# Patient Record
Sex: Female | Born: 1935 | Race: Black or African American | Hispanic: No | Smoking: Never smoker
Health system: Southern US, Community
[De-identification: ages and names within clinical notes are randomized; demographics above are authoritative.]

## PROBLEM LIST (undated history)

## (undated) DIAGNOSIS — I4891 Unspecified atrial fibrillation: Secondary | ICD-10-CM

## (undated) DIAGNOSIS — G43909 Migraine, unspecified, not intractable, without status migrainosus: Secondary | ICD-10-CM

## (undated) HISTORY — DX: Migraine, unspecified, not intractable, without status migrainosus: G43.909

## (undated) HISTORY — PX: TOTAL HIP ARTHROPLASTY: SHX124

## (undated) HISTORY — DX: Unspecified atrial fibrillation: I48.91

---

## 1999-06-21 ENCOUNTER — Ambulatory Visit (HOSPITAL_COMMUNITY): Admission: RE | Admit: 1999-06-21 | Discharge: 1999-06-21 | Payer: Self-pay | Admitting: *Deleted

## 2002-05-14 ENCOUNTER — Ambulatory Visit (HOSPITAL_COMMUNITY): Admission: RE | Admit: 2002-05-14 | Discharge: 2002-05-14 | Payer: Self-pay | Admitting: Surgery

## 2002-05-14 ENCOUNTER — Encounter: Payer: Self-pay | Admitting: Surgery

## 2010-12-23 ENCOUNTER — Encounter: Payer: Self-pay | Admitting: Internal Medicine

## 2014-12-02 HISTORY — PX: PERMANENT PACEMAKER INSERTION: SHX6023

## 2016-11-05 DIAGNOSIS — I10 Essential (primary) hypertension: Secondary | ICD-10-CM | POA: Diagnosis not present

## 2016-11-14 DIAGNOSIS — R609 Edema, unspecified: Secondary | ICD-10-CM | POA: Diagnosis not present

## 2016-11-14 DIAGNOSIS — Z79899 Other long term (current) drug therapy: Secondary | ICD-10-CM | POA: Diagnosis not present

## 2016-11-14 DIAGNOSIS — Z6824 Body mass index (BMI) 24.0-24.9, adult: Secondary | ICD-10-CM | POA: Diagnosis not present

## 2016-12-10 DIAGNOSIS — I10 Essential (primary) hypertension: Secondary | ICD-10-CM | POA: Diagnosis not present

## 2016-12-25 DIAGNOSIS — Z6824 Body mass index (BMI) 24.0-24.9, adult: Secondary | ICD-10-CM | POA: Diagnosis not present

## 2016-12-25 DIAGNOSIS — R609 Edema, unspecified: Secondary | ICD-10-CM | POA: Diagnosis not present

## 2016-12-25 DIAGNOSIS — H9319 Tinnitus, unspecified ear: Secondary | ICD-10-CM | POA: Diagnosis not present

## 2016-12-25 DIAGNOSIS — Z79899 Other long term (current) drug therapy: Secondary | ICD-10-CM | POA: Diagnosis not present

## 2017-01-06 DIAGNOSIS — I10 Essential (primary) hypertension: Secondary | ICD-10-CM | POA: Diagnosis not present

## 2017-01-30 DIAGNOSIS — I10 Essential (primary) hypertension: Secondary | ICD-10-CM | POA: Diagnosis not present

## 2017-02-03 DIAGNOSIS — R42 Dizziness and giddiness: Secondary | ICD-10-CM | POA: Diagnosis not present

## 2017-02-03 DIAGNOSIS — R609 Edema, unspecified: Secondary | ICD-10-CM | POA: Diagnosis not present

## 2017-02-03 DIAGNOSIS — I10 Essential (primary) hypertension: Secondary | ICD-10-CM | POA: Diagnosis not present

## 2017-02-03 DIAGNOSIS — R51 Headache: Secondary | ICD-10-CM | POA: Diagnosis not present

## 2017-02-03 DIAGNOSIS — H9311 Tinnitus, right ear: Secondary | ICD-10-CM | POA: Diagnosis not present

## 2017-02-05 DIAGNOSIS — R42 Dizziness and giddiness: Secondary | ICD-10-CM | POA: Diagnosis not present

## 2017-02-05 DIAGNOSIS — R609 Edema, unspecified: Secondary | ICD-10-CM | POA: Diagnosis not present

## 2017-02-05 DIAGNOSIS — R05 Cough: Secondary | ICD-10-CM | POA: Diagnosis not present

## 2017-02-05 DIAGNOSIS — H9311 Tinnitus, right ear: Secondary | ICD-10-CM | POA: Diagnosis not present

## 2017-02-05 DIAGNOSIS — Z950 Presence of cardiac pacemaker: Secondary | ICD-10-CM | POA: Diagnosis not present

## 2017-02-05 DIAGNOSIS — R7989 Other specified abnormal findings of blood chemistry: Secondary | ICD-10-CM | POA: Diagnosis not present

## 2017-02-05 DIAGNOSIS — I517 Cardiomegaly: Secondary | ICD-10-CM | POA: Diagnosis not present

## 2017-02-10 DIAGNOSIS — R609 Edema, unspecified: Secondary | ICD-10-CM | POA: Diagnosis not present

## 2017-02-10 DIAGNOSIS — I10 Essential (primary) hypertension: Secondary | ICD-10-CM | POA: Diagnosis not present

## 2017-02-10 DIAGNOSIS — E215 Disorder of parathyroid gland, unspecified: Secondary | ICD-10-CM | POA: Diagnosis not present

## 2017-02-13 DIAGNOSIS — R609 Edema, unspecified: Secondary | ICD-10-CM | POA: Diagnosis not present

## 2017-02-13 DIAGNOSIS — E559 Vitamin D deficiency, unspecified: Secondary | ICD-10-CM | POA: Diagnosis not present

## 2017-02-13 DIAGNOSIS — I10 Essential (primary) hypertension: Secondary | ICD-10-CM | POA: Diagnosis not present

## 2017-03-03 DIAGNOSIS — I10 Essential (primary) hypertension: Secondary | ICD-10-CM | POA: Diagnosis not present

## 2017-03-11 DIAGNOSIS — R51 Headache: Secondary | ICD-10-CM | POA: Diagnosis not present

## 2017-03-26 DIAGNOSIS — I10 Essential (primary) hypertension: Secondary | ICD-10-CM | POA: Diagnosis not present

## 2017-03-26 DIAGNOSIS — R42 Dizziness and giddiness: Secondary | ICD-10-CM | POA: Diagnosis not present

## 2017-03-26 DIAGNOSIS — R51 Headache: Secondary | ICD-10-CM | POA: Diagnosis not present

## 2017-03-26 DIAGNOSIS — H9209 Otalgia, unspecified ear: Secondary | ICD-10-CM | POA: Diagnosis not present

## 2017-03-26 DIAGNOSIS — H9311 Tinnitus, right ear: Secondary | ICD-10-CM | POA: Diagnosis not present

## 2017-03-26 DIAGNOSIS — R609 Edema, unspecified: Secondary | ICD-10-CM | POA: Diagnosis not present

## 2017-03-26 DIAGNOSIS — R269 Unspecified abnormalities of gait and mobility: Secondary | ICD-10-CM | POA: Diagnosis not present

## 2017-04-03 DIAGNOSIS — I10 Essential (primary) hypertension: Secondary | ICD-10-CM | POA: Diagnosis not present

## 2017-04-14 DIAGNOSIS — H9311 Tinnitus, right ear: Secondary | ICD-10-CM | POA: Diagnosis not present

## 2017-04-14 DIAGNOSIS — Q181 Preauricular sinus and cyst: Secondary | ICD-10-CM | POA: Diagnosis not present

## 2017-04-14 DIAGNOSIS — H903 Sensorineural hearing loss, bilateral: Secondary | ICD-10-CM | POA: Diagnosis not present

## 2017-04-17 DIAGNOSIS — R262 Difficulty in walking, not elsewhere classified: Secondary | ICD-10-CM | POA: Diagnosis not present

## 2017-04-17 DIAGNOSIS — R531 Weakness: Secondary | ICD-10-CM | POA: Diagnosis not present

## 2017-04-17 DIAGNOSIS — R269 Unspecified abnormalities of gait and mobility: Secondary | ICD-10-CM | POA: Diagnosis not present

## 2017-04-18 DIAGNOSIS — E559 Vitamin D deficiency, unspecified: Secondary | ICD-10-CM | POA: Diagnosis not present

## 2017-04-18 DIAGNOSIS — R799 Abnormal finding of blood chemistry, unspecified: Secondary | ICD-10-CM | POA: Diagnosis not present

## 2017-04-25 DIAGNOSIS — R799 Abnormal finding of blood chemistry, unspecified: Secondary | ICD-10-CM | POA: Diagnosis not present

## 2017-05-05 DIAGNOSIS — I10 Essential (primary) hypertension: Secondary | ICD-10-CM | POA: Diagnosis not present

## 2017-05-12 DIAGNOSIS — R799 Abnormal finding of blood chemistry, unspecified: Secondary | ICD-10-CM | POA: Diagnosis not present

## 2017-05-27 DIAGNOSIS — I10 Essential (primary) hypertension: Secondary | ICD-10-CM | POA: Diagnosis not present

## 2017-06-03 DIAGNOSIS — I10 Essential (primary) hypertension: Secondary | ICD-10-CM | POA: Diagnosis not present

## 2017-06-12 DIAGNOSIS — H02831 Dermatochalasis of right upper eyelid: Secondary | ICD-10-CM | POA: Diagnosis not present

## 2017-06-12 DIAGNOSIS — H2513 Age-related nuclear cataract, bilateral: Secondary | ICD-10-CM | POA: Diagnosis not present

## 2017-06-12 DIAGNOSIS — H25013 Cortical age-related cataract, bilateral: Secondary | ICD-10-CM | POA: Diagnosis not present

## 2017-06-12 DIAGNOSIS — H01001 Unspecified blepharitis right upper eyelid: Secondary | ICD-10-CM | POA: Diagnosis not present

## 2017-06-12 DIAGNOSIS — H04123 Dry eye syndrome of bilateral lacrimal glands: Secondary | ICD-10-CM | POA: Diagnosis not present

## 2017-07-02 DIAGNOSIS — I10 Essential (primary) hypertension: Secondary | ICD-10-CM | POA: Diagnosis not present

## 2017-07-11 DIAGNOSIS — Z7901 Long term (current) use of anticoagulants: Secondary | ICD-10-CM | POA: Diagnosis not present

## 2017-07-11 DIAGNOSIS — I517 Cardiomegaly: Secondary | ICD-10-CM | POA: Diagnosis not present

## 2017-07-11 DIAGNOSIS — Z950 Presence of cardiac pacemaker: Secondary | ICD-10-CM | POA: Diagnosis not present

## 2017-07-11 DIAGNOSIS — I340 Nonrheumatic mitral (valve) insufficiency: Secondary | ICD-10-CM | POA: Diagnosis not present

## 2017-07-11 DIAGNOSIS — R079 Chest pain, unspecified: Secondary | ICD-10-CM | POA: Diagnosis not present

## 2017-07-11 DIAGNOSIS — N189 Chronic kidney disease, unspecified: Secondary | ICD-10-CM | POA: Diagnosis not present

## 2017-07-11 DIAGNOSIS — R000 Tachycardia, unspecified: Secondary | ICD-10-CM | POA: Diagnosis not present

## 2017-07-11 DIAGNOSIS — I351 Nonrheumatic aortic (valve) insufficiency: Secondary | ICD-10-CM | POA: Diagnosis not present

## 2017-07-11 DIAGNOSIS — I358 Other nonrheumatic aortic valve disorders: Secondary | ICD-10-CM | POA: Diagnosis not present

## 2017-07-11 DIAGNOSIS — I495 Sick sinus syndrome: Secondary | ICD-10-CM | POA: Diagnosis not present

## 2017-07-11 DIAGNOSIS — I480 Paroxysmal atrial fibrillation: Secondary | ICD-10-CM | POA: Diagnosis not present

## 2017-07-11 DIAGNOSIS — R002 Palpitations: Secondary | ICD-10-CM | POA: Diagnosis not present

## 2017-07-11 DIAGNOSIS — I2510 Atherosclerotic heart disease of native coronary artery without angina pectoris: Secondary | ICD-10-CM | POA: Diagnosis not present

## 2017-07-11 DIAGNOSIS — I129 Hypertensive chronic kidney disease with stage 1 through stage 4 chronic kidney disease, or unspecified chronic kidney disease: Secondary | ICD-10-CM | POA: Diagnosis not present

## 2017-07-11 DIAGNOSIS — I214 Non-ST elevation (NSTEMI) myocardial infarction: Secondary | ICD-10-CM | POA: Diagnosis not present

## 2017-07-11 DIAGNOSIS — I10 Essential (primary) hypertension: Secondary | ICD-10-CM | POA: Diagnosis not present

## 2017-07-11 DIAGNOSIS — I080 Rheumatic disorders of both mitral and aortic valves: Secondary | ICD-10-CM | POA: Diagnosis not present

## 2017-07-16 DIAGNOSIS — R42 Dizziness and giddiness: Secondary | ICD-10-CM | POA: Diagnosis not present

## 2017-07-16 DIAGNOSIS — Z1389 Encounter for screening for other disorder: Secondary | ICD-10-CM | POA: Diagnosis not present

## 2017-07-16 DIAGNOSIS — I10 Essential (primary) hypertension: Secondary | ICD-10-CM | POA: Diagnosis not present

## 2017-07-16 DIAGNOSIS — Z136 Encounter for screening for cardiovascular disorders: Secondary | ICD-10-CM | POA: Diagnosis not present

## 2017-07-16 DIAGNOSIS — R269 Unspecified abnormalities of gait and mobility: Secondary | ICD-10-CM | POA: Diagnosis not present

## 2017-07-16 DIAGNOSIS — R609 Edema, unspecified: Secondary | ICD-10-CM | POA: Diagnosis not present

## 2017-07-16 DIAGNOSIS — R799 Abnormal finding of blood chemistry, unspecified: Secondary | ICD-10-CM | POA: Diagnosis not present

## 2017-07-18 DIAGNOSIS — R799 Abnormal finding of blood chemistry, unspecified: Secondary | ICD-10-CM | POA: Diagnosis not present

## 2017-07-31 DIAGNOSIS — I495 Sick sinus syndrome: Secondary | ICD-10-CM | POA: Diagnosis not present

## 2017-07-31 DIAGNOSIS — Z45018 Encounter for adjustment and management of other part of cardiac pacemaker: Secondary | ICD-10-CM | POA: Diagnosis not present

## 2017-07-31 DIAGNOSIS — I951 Orthostatic hypotension: Secondary | ICD-10-CM | POA: Diagnosis not present

## 2017-07-31 DIAGNOSIS — I10 Essential (primary) hypertension: Secondary | ICD-10-CM | POA: Diagnosis not present

## 2017-07-31 DIAGNOSIS — I4891 Unspecified atrial fibrillation: Secondary | ICD-10-CM | POA: Diagnosis not present

## 2017-07-31 DIAGNOSIS — I480 Paroxysmal atrial fibrillation: Secondary | ICD-10-CM | POA: Diagnosis not present

## 2017-08-05 DIAGNOSIS — I10 Essential (primary) hypertension: Secondary | ICD-10-CM | POA: Diagnosis not present

## 2017-08-08 DIAGNOSIS — R42 Dizziness and giddiness: Secondary | ICD-10-CM | POA: Diagnosis not present

## 2017-08-08 DIAGNOSIS — R799 Abnormal finding of blood chemistry, unspecified: Secondary | ICD-10-CM | POA: Diagnosis not present

## 2017-08-08 DIAGNOSIS — I249 Acute ischemic heart disease, unspecified: Secondary | ICD-10-CM | POA: Diagnosis not present

## 2017-08-08 DIAGNOSIS — R197 Diarrhea, unspecified: Secondary | ICD-10-CM | POA: Diagnosis not present

## 2017-08-08 DIAGNOSIS — R0602 Shortness of breath: Secondary | ICD-10-CM | POA: Diagnosis not present

## 2017-08-08 DIAGNOSIS — M79609 Pain in unspecified limb: Secondary | ICD-10-CM | POA: Diagnosis not present

## 2017-08-13 DIAGNOSIS — H9311 Tinnitus, right ear: Secondary | ICD-10-CM | POA: Diagnosis not present

## 2017-08-19 DIAGNOSIS — E210 Primary hyperparathyroidism: Secondary | ICD-10-CM | POA: Diagnosis not present

## 2017-08-21 DIAGNOSIS — R233 Spontaneous ecchymoses: Secondary | ICD-10-CM | POA: Diagnosis not present

## 2017-08-21 DIAGNOSIS — L299 Pruritus, unspecified: Secondary | ICD-10-CM | POA: Diagnosis not present

## 2017-09-16 ENCOUNTER — Ambulatory Visit: Payer: Medicare Other | Admitting: Neurology

## 2017-09-22 ENCOUNTER — Ambulatory Visit (INDEPENDENT_AMBULATORY_CARE_PROVIDER_SITE_OTHER): Payer: Medicare Other | Admitting: Neurology

## 2017-09-22 ENCOUNTER — Encounter: Payer: Self-pay | Admitting: Neurology

## 2017-09-22 DIAGNOSIS — R270 Ataxia, unspecified: Secondary | ICD-10-CM

## 2017-09-22 DIAGNOSIS — G3281 Cerebellar ataxia in diseases classified elsewhere: Secondary | ICD-10-CM

## 2017-09-22 DIAGNOSIS — R413 Other amnesia: Secondary | ICD-10-CM | POA: Diagnosis not present

## 2017-09-22 DIAGNOSIS — R2689 Other abnormalities of gait and mobility: Secondary | ICD-10-CM

## 2017-09-22 DIAGNOSIS — M48062 Spinal stenosis, lumbar region with neurogenic claudication: Secondary | ICD-10-CM

## 2017-09-22 DIAGNOSIS — W19XXXA Unspecified fall, initial encounter: Secondary | ICD-10-CM

## 2017-09-22 DIAGNOSIS — M5442 Lumbago with sciatica, left side: Secondary | ICD-10-CM

## 2017-09-22 DIAGNOSIS — G20 Parkinson's disease: Secondary | ICD-10-CM

## 2017-09-22 DIAGNOSIS — M5441 Lumbago with sciatica, right side: Secondary | ICD-10-CM

## 2017-09-22 DIAGNOSIS — R269 Unspecified abnormalities of gait and mobility: Secondary | ICD-10-CM | POA: Diagnosis not present

## 2017-09-22 DIAGNOSIS — Z9181 History of falling: Secondary | ICD-10-CM

## 2017-09-22 DIAGNOSIS — M542 Cervicalgia: Secondary | ICD-10-CM

## 2017-09-22 NOTE — Patient Instructions (Signed)
Remember to drink plenty of fluid, eat healthy meals and do not skip any meals. Try to eat protein with a every meal and eat a healthy snack such as fruit or nuts in between meals. Try to keep a regular sleep-wake schedule and try to exercise daily, particularly in the form of walking, 20-30 minutes a day, if you can.   As far as diagnostic testing: [facility], CT of the brain, neck and low back,   I would like to see you back in 3 months, sooner if we need to. Please call us with any interim questions, concerns, problems, updates or refill requests.   Our phone number is [phone]. We also have an after hours call service for urgent matters and there is a physician on-call for urgent questions. For any emergencies you know to call 911 or go to the nearest emergency room

## 2017-09-22 NOTE — Progress Notes (Addendum)
[facility]    Provider:  Dr Jaynee Eagles Referring Provider: Barnie Mort, NP Primary Care Physician:  Barnie Mort, NP  CC:  Walking like a drunk  HPI:  Kim Townsend is a 83 y.o. female here as a referral from Dr. Kalman Shan for dizziness. PMHx migraine and afib on Eliquis, s/p pacemaker, HLD, HTN. She says she walks "like a drunk person". She denies any significant dizziness or vertigo or lightheadedness except maybe briefly when getting out of bed but this is not the reason for her gait abnormality as she continuous feels drunk when walking (not dizzy)Has been ongoing this year.  She denies feeling dizzy or lightheaded or it isn't coincident with the "feeling drunk", she can't write straight. When she gets up to walk she feels drunk, she can't walk good. She got a pacemaker 2 years ago, but no inciting events, She has difficulty getting out of low seats. She feels sleepy during the day. She has no tremors or shaking. No focal weakness. But she feels like she can't reach up high. She is losing weight. No neck pain or back pain. She feels it is more in her legs. She has a lot of swelling. She has memory loss as well. She has daily headaches. No numbness or tingling in the feet. She has memory problems, forgets where she puts things. She lives with her son. No falls. No problems chewing or swallowing. She walks slower. She has been to the ENT. She has Tinnitus. She has nose running. Her son has parkinson's disease he is 36.   Reviewed pcp notes, she stated she complained of dizziness when she got up to walk. Worsened over several days. Stated she is "walking like a drunk person". Patient referred to Cardiology.    CT head: personally reviewed images. Stable mild atrophy more pronounced in the frontoparietal areas. No ventriculomegaly.Prior infarct chronic left whote matter frontal. Mild white matter changes likely microvascular ischemic changes.   Review of Systems: Patient  complains of symptoms per HPI as well as the following symptoms: feeling cold, memory loss, headache, mumur, swelling in legs, hearing loss, ringing in ears, spinning, rash, itching. Pertinent negatives and positives per HPI. All others negative.   Social History   Social History  . Marital status: Widowed    Spouse name: N/A  . Number of children: N/A  . Years of education: N/A   Occupational History  . retired    Social History Main Topics  . Smoking status: Never Smoker  . Smokeless tobacco: Never Used  . Alcohol use No  . Drug use: No  . Sexual activity: Not on file   Other Topics Concern  . Not on file   Social History Narrative   Lives at home with son   Rt handed   1 cup of caffeine daily    Family History  Problem Relation Age of Onset  . Alzheimer's disease Mother   . Parkinson's disease Neg Hx     Past Medical History:  Diagnosis Date  . Atrial fibrillation ([place])   . Migraine   . Migraine     Past Surgical History:  Procedure Laterality Date  . PERMANENT PACEMAKER INSERTION  2016  . TOTAL HIP ARTHROPLASTY Bilateral     Current Outpatient Prescriptions  Medication Sig Dispense Refill  . ELIQUIS 2.5 MG TABS tablet Take 1 tablet by mouth 2 (two) times daily.     . furosemide (LASIX) 40 MG tablet Take 1 tablet by mouth  daily.    . losartan (COZAAR) 25 MG tablet Take 1 tablet by mouth daily.    . sotalol (BETAPACE) 80 MG tablet Take 1 tablet by mouth 2 (two) times daily.    Marland Kitchen triamcinolone cream (KENALOG) 0.1 % Apply 1 application topically 2 (two) times daily as needed. Apply to legs     No current facility-administered medications for this visit.     Allergies as of 09/22/2017  . (Not on File)    Vitals: There were no vitals taken for this visit. Last Weight:  Wt Readings from Last 1 Encounters:  No data found for Wt   Last Height:   Ht Readings from Last 1 Encounters:  No data found for Ht   BP sitting 160/82 P 60; sitting 196/84 P64,  standing 223/84 pulse 61   Physical exam: Exam: Gen: NAD, conversant CV: RRR, +SEM. No Carotid Bruits. +peripheral edema, warm, nontender Eyes: Conjunctivae clear without exudates or hemorrhage  Neuro: Detailed Neurologic Exam  Speech:    Speech is normal; fluent and spontaneous with normal comprehension.  Cognition:     MMSE - Mini Mental State Exam 09/22/2017  Orientation to time 3  Orientation to Place 5  Registration 3  Attention/ Calculation 5  Recall 0  Language- name 2 objects 2  Language- repeat 1  Language- follow 3 step command 3  Language- read & follow direction 1  Write a sentence 1  Copy design 0  Total score 24    Cranial Nerves: Hypomimia    The pupils are equal, round, and reactive to light. Attempted fundoscopic exam couldnot visualize. Marland Kitchenleft homonomous upper quadrant hemianopia. Extraocular movements are intact. Trigeminal sensation is intact and the muscles of mastication are normal. The face is symmetric. The palate elevates in the midline. Hearing impaired. Voice is hypophonic. Shoulder shrug is normal. The tongue has normal motion without fasciculations.   Coordination:    Dec amplitude finger taps with bradykinesia  Gait:    Stooped, short strides, narrow stance, stooped, decreased arm swing, imbalance  Motor Observation:    No asymmetry, no atrophy, and no involuntary movements noted. Tone:    Possibly mild cogwheeling right arm with facilitation  Posture:    Posture is normal. normal erect    Strength: Difficult but prox weakness     Strength is V/V in the upper and lower limbs.      Sensation: intact to LT     Reflex Exam:  DTR's:    Deep tendon reflexes in the upper and lower extremities are symmetrical bilaterally, hypo in the lowers and brisk in the uppers for age  Toes:    The toes are equivocal bilaterally.   Clonus:    Clonus is absent.       Assessment/Plan:  83 year old with Parkinsonism. Need to evaluate for other  causes of gait instability and imbalalnce. MMSE 24/30.   Fall risk: Needs physical therapy will send to her home Recommend CT of the head and brain to evaluate for strokes, other causes of gait ataxia for reversible causes of dementia CT of the cervical spine to evaluate for myelopathy as a cause of ataxia for surgical and other treatment evaluation CT of the lumbar spine to evaluate for lumbar stenosis with neurogenic claudication as a cause for her ataxia for surgical or other treatment options If can't find any other reason recommend DAT Scan Labs today  Patient was seen in neurosurgery.  They did not think that the moderate cervical  stenosis was causing any of her gait symptoms.  Stated while she does have a significant amount of both cervical and lumbar spondylitic disease, I do not think that these are responsible for her gait.  He did not think the symptoms are related to cervical myelopathy or lumbar spine disease neurogenic claudication.  Orders Placed This Encounter  Procedures  . CT HEAD WO CONTRAST  . CT CERVICAL SPINE WO CONTRAST  . CT LUMBAR SPINE W WO CONTRAST  . CBC  . Comprehensive metabolic panel  . RPR  . B12 and Folate Panel  . Methylmalonic acid, serum  . CK  . Ambulatory referral to Home Health   Cc:  Barnie Mort, NP  Sarina Ill, MD  [facility] [address] [place], [place] [address]  Phone [phone] Fax [phone]

## 2017-09-23 ENCOUNTER — Encounter: Payer: Self-pay | Admitting: Neurology

## 2017-09-23 DIAGNOSIS — G20 Parkinson's disease: Secondary | ICD-10-CM | POA: Insufficient documentation

## 2017-09-25 ENCOUNTER — Telehealth: Payer: Self-pay | Admitting: Neurology

## 2017-09-25 LAB — COMPREHENSIVE METABOLIC PANEL
ALT: 10 IU/L (ref 0–32)
AST: 19 IU/L (ref 0–40)
Albumin/Globulin Ratio: 1.4 (ref 1.2–2.2)
Albumin: 4.1 g/dL (ref 3.5–4.7)
Alkaline Phosphatase: 117 IU/L (ref 39–117)
BUN/Creatinine Ratio: 21 (ref 12–28)
BUN: 21 mg/dL (ref 8–27)
Bilirubin Total: 0.6 mg/dL (ref 0.0–1.2)
CO2: 23 mmol/L (ref 20–29)
Calcium: 11.3 mg/dL — ABNORMAL HIGH (ref 8.7–10.3)
Chloride: 105 mmol/L (ref 96–106)
Creatinine, Ser: 1.01 mg/dL — ABNORMAL HIGH (ref 0.57–1.00)
GFR calc Af Amer: 57 mL/min/{1.73_m2} — ABNORMAL LOW (ref >59)
GFR calc non Af Amer: 49 mL/min/{1.73_m2} — ABNORMAL LOW (ref >59)
Globulin, Total: 3 g/dL (ref 1.5–4.5)
Glucose: 78 mg/dL (ref 65–99)
Potassium: 4.5 mmol/L (ref 3.5–5.2)
Sodium: 141 mmol/L (ref 134–144)
Total Protein: 7.1 g/dL (ref 6.0–8.5)

## 2017-09-25 LAB — CBC
Hematocrit: 40.9 % (ref 34.0–46.6)
Hemoglobin: 13 g/dL (ref 11.1–15.9)
MCH: 28.6 pg (ref 26.6–33.0)
MCHC: 31.8 g/dL (ref 31.5–35.7)
MCV: 90 fL (ref 79–97)
Platelets: 258 10*3/uL (ref 150–379)
RBC: 4.54 x10E6/uL (ref 3.77–5.28)
RDW: 13.3 % (ref 12.3–15.4)
WBC: 6.9 10*3/uL (ref 3.4–10.8)

## 2017-09-25 LAB — RPR: RPR Ser Ql: NONREACTIVE

## 2017-09-25 LAB — CK: Total CK: 77 U/L (ref 24–173)

## 2017-09-25 LAB — B12 AND FOLATE PANEL
Folate: 15.6 ng/mL (ref >3.0)
Vitamin B-12: 284 pg/mL (ref 232–1245)

## 2017-09-25 LAB — METHYLMALONIC ACID, SERUM: Methylmalonic Acid: 299 nmol/L (ref 0–378)

## 2017-09-25 NOTE — Telephone Encounter (Signed)
-----   Message from Melvenia Beam, MD sent at 09/25/2017  2:42 PM EDT ----- Labs unremarkable

## 2017-09-25 NOTE — Telephone Encounter (Signed)
Called the patient and went over her lab results. Made her aware of the normal lab values. Pt verbalized understanding. Pt had no questions at this time but was encouraged to call back if questions arise.

## 2017-09-26 ENCOUNTER — Telehealth: Payer: Self-pay | Admitting: Neurology

## 2017-09-26 NOTE — Telephone Encounter (Signed)
Spoke with [employer] Due to not having a nurse for this they will transfer the referral to [facility] in [facility]. Patient has order for AMB referral for home health.

## 2017-09-26 NOTE — Telephone Encounter (Signed)
[name] [phone] called said she does not nursing coverage for the [place] area right now. This pt needs nursing and PT.

## 2017-09-26 NOTE — Telephone Encounter (Signed)
I'm ok just with PT, I thought in order for PT to go to the home you had to have nursing first. I'm ok just with PT Hinton Dyer can you help?

## 2017-09-29 NOTE — Telephone Encounter (Signed)
Nothing needs to be done. Referral for Home PT is just being ing  sent to another branch  [facility] due to area she lives in. Patient is getting service she needs. Thanks Hinton Dyer.

## 2017-09-30 NOTE — Telephone Encounter (Signed)
FYI-Amanda with [facility] calling stating she called patient to schedule PT but patient said she would call her back to schedule because she had to attend a funeral.

## 2017-10-03 DIAGNOSIS — I10 Essential (primary) hypertension: Secondary | ICD-10-CM | POA: Diagnosis not present

## 2017-10-10 DIAGNOSIS — L84 Corns and callosities: Secondary | ICD-10-CM | POA: Diagnosis not present

## 2017-10-10 DIAGNOSIS — B351 Tinea unguium: Secondary | ICD-10-CM | POA: Diagnosis not present

## 2017-10-10 DIAGNOSIS — R21 Rash and other nonspecific skin eruption: Secondary | ICD-10-CM | POA: Diagnosis not present

## 2017-10-21 DIAGNOSIS — M79674 Pain in right toe(s): Secondary | ICD-10-CM | POA: Diagnosis not present

## 2017-10-21 DIAGNOSIS — B351 Tinea unguium: Secondary | ICD-10-CM | POA: Diagnosis not present

## 2017-10-21 DIAGNOSIS — M79675 Pain in left toe(s): Secondary | ICD-10-CM | POA: Diagnosis not present

## 2017-11-03 DIAGNOSIS — I10 Essential (primary) hypertension: Secondary | ICD-10-CM | POA: Diagnosis not present

## 2017-11-18 ENCOUNTER — Telehealth: Payer: Self-pay | Admitting: Neurology

## 2017-11-18 DIAGNOSIS — I10 Essential (primary) hypertension: Secondary | ICD-10-CM | POA: Diagnosis not present

## 2017-11-18 DIAGNOSIS — I160 Hypertensive urgency: Secondary | ICD-10-CM | POA: Diagnosis not present

## 2017-11-18 DIAGNOSIS — R42 Dizziness and giddiness: Secondary | ICD-10-CM | POA: Diagnosis not present

## 2017-11-18 DIAGNOSIS — Z950 Presence of cardiac pacemaker: Secondary | ICD-10-CM | POA: Diagnosis not present

## 2017-11-18 DIAGNOSIS — R2681 Unsteadiness on feet: Secondary | ICD-10-CM | POA: Diagnosis not present

## 2017-11-18 DIAGNOSIS — I959 Hypotension, unspecified: Secondary | ICD-10-CM | POA: Diagnosis not present

## 2017-11-18 DIAGNOSIS — I480 Paroxysmal atrial fibrillation: Secondary | ICD-10-CM | POA: Diagnosis not present

## 2017-11-18 DIAGNOSIS — R269 Unspecified abnormalities of gait and mobility: Secondary | ICD-10-CM | POA: Diagnosis not present

## 2017-11-18 DIAGNOSIS — R51 Headache: Secondary | ICD-10-CM | POA: Diagnosis not present

## 2017-11-18 DIAGNOSIS — R21 Rash and other nonspecific skin eruption: Secondary | ICD-10-CM | POA: Diagnosis not present

## 2017-11-18 DIAGNOSIS — G969 Disorder of central nervous system, unspecified: Secondary | ICD-10-CM | POA: Diagnosis not present

## 2017-11-18 DIAGNOSIS — E210 Primary hyperparathyroidism: Secondary | ICD-10-CM | POA: Diagnosis not present

## 2017-11-18 DIAGNOSIS — Z79899 Other long term (current) drug therapy: Secondary | ICD-10-CM | POA: Diagnosis not present

## 2017-11-18 NOTE — Telephone Encounter (Signed)
Patient needs to complete the workup before I see them back thanks

## 2017-11-18 NOTE — Telephone Encounter (Addendum)
Called Leslie back @ [facility] (patient's primary care). She reported that per pt's PCP, pt has CT lumbar spine, C-spine & head that need to be done and pt needs to be seen ASAP due to worsened balance and dizziness (pt was seen by PCP today). I looked at the ordered scans and it is documented that the daughter states she will call back and schedule all 3 scans. Patient also already has a f/u appt scheduled 12/23/17 @ 1:00 pm. I informed Magda Paganini of the above information and she verbalized understanding. I also advised that Dr. Jaynee Eagles would like want the scans done before she sees her.

## 2017-11-18 NOTE — Telephone Encounter (Signed)
Kim Townsend from [facility] has called and is asking for a call to discuss pt:, she states she see's from last note that a C-Spine and L-Spine were ordered but has not been done.  She also stated there has been an increase of dizziness and needs to be contacted for scheduling as soon as possible. Kim Townsend can be reached at [phone]

## 2017-11-19 DIAGNOSIS — E210 Primary hyperparathyroidism: Secondary | ICD-10-CM | POA: Diagnosis not present

## 2017-11-19 DIAGNOSIS — H9190 Unspecified hearing loss, unspecified ear: Secondary | ICD-10-CM | POA: Diagnosis not present

## 2017-11-19 DIAGNOSIS — I10 Essential (primary) hypertension: Secondary | ICD-10-CM | POA: Diagnosis not present

## 2017-11-19 DIAGNOSIS — Z950 Presence of cardiac pacemaker: Secondary | ICD-10-CM | POA: Diagnosis not present

## 2017-11-19 DIAGNOSIS — I480 Paroxysmal atrial fibrillation: Secondary | ICD-10-CM | POA: Diagnosis not present

## 2017-11-27 DIAGNOSIS — I10 Essential (primary) hypertension: Secondary | ICD-10-CM | POA: Diagnosis not present

## 2017-11-27 DIAGNOSIS — R21 Rash and other nonspecific skin eruption: Secondary | ICD-10-CM | POA: Diagnosis not present

## 2017-11-27 NOTE — Telephone Encounter (Signed)
[facility]/PCP office called she said the pt was in the office, she was calling checking on next appt date. In talking with her the PCP wanted the pt seen sooner, message below was given to her. She was also given GI phone number and said she would give to daughter to get the scans done. FYI

## 2017-12-03 DIAGNOSIS — I10 Essential (primary) hypertension: Secondary | ICD-10-CM | POA: Diagnosis not present

## 2017-12-10 DIAGNOSIS — I10 Essential (primary) hypertension: Secondary | ICD-10-CM | POA: Diagnosis not present

## 2017-12-10 DIAGNOSIS — H9311 Tinnitus, right ear: Secondary | ICD-10-CM | POA: Diagnosis not present

## 2017-12-10 DIAGNOSIS — R51 Headache: Secondary | ICD-10-CM | POA: Diagnosis not present

## 2017-12-23 ENCOUNTER — Ambulatory Visit: Payer: Medicare Other | Admitting: Neurology

## 2017-12-23 ENCOUNTER — Telehealth: Payer: Self-pay | Admitting: Neurology

## 2017-12-23 NOTE — Telephone Encounter (Signed)
Patient and her daughter came to the office today to be seen but patient had not had her CT's done.  Patient's daughter never called Raquel Sarna back to  schedule.  Patient daughter requesting to have CT at [facility] .   Please call Laverne at [phone] or Madaline Guthrie [phone] to schedule.

## 2017-12-24 NOTE — Telephone Encounter (Signed)
Patient is schedule to have her CT's done on Friday 12/26/17 at [place] arrival time is 12:00 pm. Patient daughter Yannely is aware of time and day.

## 2017-12-24 NOTE — Telephone Encounter (Signed)
[place] [phone] opt [phone] called she is needing correct dx for lumbar CT-the one given was for brain. Order needs to be without since it is back pain. Please call to advise

## 2017-12-24 NOTE — Addendum Note (Signed)
Addended by: Sarina Ill B on: 12/24/2017 02:50 PM   Modules accepted: Orders

## 2017-12-24 NOTE — Telephone Encounter (Signed)
When you get a chance can you please change the order to a CT Lumbar spine wo contrast and also on the order can you use a diagnose's code for back pain?

## 2017-12-26 DIAGNOSIS — M48061 Spinal stenosis, lumbar region without neurogenic claudication: Secondary | ICD-10-CM | POA: Diagnosis not present

## 2017-12-26 DIAGNOSIS — M5126 Other intervertebral disc displacement, lumbar region: Secondary | ICD-10-CM | POA: Diagnosis not present

## 2017-12-26 DIAGNOSIS — M5441 Lumbago with sciatica, right side: Secondary | ICD-10-CM | POA: Diagnosis not present

## 2017-12-26 DIAGNOSIS — R413 Other amnesia: Secondary | ICD-10-CM | POA: Diagnosis not present

## 2017-12-26 DIAGNOSIS — I700 Atherosclerosis of aorta: Secondary | ICD-10-CM | POA: Diagnosis not present

## 2017-12-26 DIAGNOSIS — M5030 Other cervical disc degeneration, unspecified cervical region: Secondary | ICD-10-CM | POA: Diagnosis not present

## 2017-12-26 DIAGNOSIS — R42 Dizziness and giddiness: Secondary | ICD-10-CM | POA: Diagnosis not present

## 2017-12-26 DIAGNOSIS — M47816 Spondylosis without myelopathy or radiculopathy, lumbar region: Secondary | ICD-10-CM | POA: Diagnosis not present

## 2017-12-26 DIAGNOSIS — M4802 Spinal stenosis, cervical region: Secondary | ICD-10-CM | POA: Diagnosis not present

## 2018-01-02 DIAGNOSIS — I10 Essential (primary) hypertension: Secondary | ICD-10-CM | POA: Diagnosis not present

## 2018-01-16 DIAGNOSIS — I10 Essential (primary) hypertension: Secondary | ICD-10-CM | POA: Diagnosis not present

## 2018-01-16 DIAGNOSIS — R42 Dizziness and giddiness: Secondary | ICD-10-CM | POA: Diagnosis not present

## 2018-01-16 DIAGNOSIS — R21 Rash and other nonspecific skin eruption: Secondary | ICD-10-CM | POA: Diagnosis not present

## 2018-01-30 DIAGNOSIS — I10 Essential (primary) hypertension: Secondary | ICD-10-CM | POA: Diagnosis not present

## 2018-02-05 DIAGNOSIS — R600 Localized edema: Secondary | ICD-10-CM | POA: Diagnosis not present

## 2018-02-05 DIAGNOSIS — L300 Nummular dermatitis: Secondary | ICD-10-CM | POA: Diagnosis not present

## 2018-02-10 DIAGNOSIS — E210 Primary hyperparathyroidism: Secondary | ICD-10-CM | POA: Diagnosis not present

## 2018-02-19 DIAGNOSIS — M79675 Pain in left toe(s): Secondary | ICD-10-CM | POA: Diagnosis not present

## 2018-02-19 DIAGNOSIS — M79674 Pain in right toe(s): Secondary | ICD-10-CM | POA: Diagnosis not present

## 2018-02-19 DIAGNOSIS — B351 Tinea unguium: Secondary | ICD-10-CM | POA: Diagnosis not present

## 2018-03-03 DIAGNOSIS — I10 Essential (primary) hypertension: Secondary | ICD-10-CM | POA: Diagnosis not present

## 2018-03-10 DIAGNOSIS — M79604 Pain in right leg: Secondary | ICD-10-CM | POA: Diagnosis not present

## 2018-03-10 DIAGNOSIS — M7731 Calcaneal spur, right foot: Secondary | ICD-10-CM | POA: Diagnosis not present

## 2018-03-10 DIAGNOSIS — M25571 Pain in right ankle and joints of right foot: Secondary | ICD-10-CM | POA: Diagnosis not present

## 2018-03-10 DIAGNOSIS — M7989 Other specified soft tissue disorders: Secondary | ICD-10-CM | POA: Diagnosis not present

## 2018-03-10 DIAGNOSIS — R609 Edema, unspecified: Secondary | ICD-10-CM | POA: Diagnosis not present

## 2018-03-10 DIAGNOSIS — M85871 Other specified disorders of bone density and structure, right ankle and foot: Secondary | ICD-10-CM | POA: Diagnosis not present

## 2018-03-10 DIAGNOSIS — I10 Essential (primary) hypertension: Secondary | ICD-10-CM | POA: Diagnosis not present

## 2018-03-10 DIAGNOSIS — M19071 Primary osteoarthritis, right ankle and foot: Secondary | ICD-10-CM | POA: Diagnosis not present

## 2018-03-13 DIAGNOSIS — I10 Essential (primary) hypertension: Secondary | ICD-10-CM | POA: Diagnosis not present

## 2018-03-13 DIAGNOSIS — R8290 Unspecified abnormal findings in urine: Secondary | ICD-10-CM | POA: Diagnosis not present

## 2018-03-23 DIAGNOSIS — I10 Essential (primary) hypertension: Secondary | ICD-10-CM | POA: Diagnosis not present

## 2018-03-23 DIAGNOSIS — R609 Edema, unspecified: Secondary | ICD-10-CM | POA: Diagnosis not present

## 2018-03-23 DIAGNOSIS — R799 Abnormal finding of blood chemistry, unspecified: Secondary | ICD-10-CM | POA: Diagnosis not present

## 2018-03-24 DIAGNOSIS — M19071 Primary osteoarthritis, right ankle and foot: Secondary | ICD-10-CM | POA: Diagnosis not present

## 2018-04-02 DIAGNOSIS — I10 Essential (primary) hypertension: Secondary | ICD-10-CM | POA: Diagnosis not present

## 2018-04-15 DIAGNOSIS — R609 Edema, unspecified: Secondary | ICD-10-CM | POA: Diagnosis not present

## 2018-04-15 DIAGNOSIS — I10 Essential (primary) hypertension: Secondary | ICD-10-CM | POA: Diagnosis not present

## 2018-04-15 DIAGNOSIS — E215 Disorder of parathyroid gland, unspecified: Secondary | ICD-10-CM | POA: Diagnosis not present

## 2018-04-16 ENCOUNTER — Telehealth: Payer: Self-pay | Admitting: Neurology

## 2018-04-16 DIAGNOSIS — M542 Cervicalgia: Secondary | ICD-10-CM

## 2018-04-16 DIAGNOSIS — R270 Ataxia, unspecified: Secondary | ICD-10-CM

## 2018-04-16 DIAGNOSIS — M4802 Spinal stenosis, cervical region: Secondary | ICD-10-CM

## 2018-04-16 DIAGNOSIS — R269 Unspecified abnormalities of gait and mobility: Secondary | ICD-10-CM

## 2018-04-16 DIAGNOSIS — R2689 Other abnormalities of gait and mobility: Secondary | ICD-10-CM

## 2018-04-16 NOTE — Telephone Encounter (Signed)
Pts daughter Lenna Sciara called requesting the results from the pts CT. Stating she had it done around the end of January. Please call to advise pt of CT result.

## 2018-04-16 NOTE — Telephone Encounter (Signed)
I order CT head, cervical and Lumbar. This is the patient who had not yet had imaging or followed my instructions at follow up, so we rescheduled her follow up and Hinton Dyer or Raquel Sarna tried to help get her scheduled fo rimages. Emily/Dana - where did she go for imaging?

## 2018-04-17 NOTE — Addendum Note (Signed)
Addended by: Darleen Crocker on: 04/17/2018 10:23 AM   Modules accepted: Orders

## 2018-04-17 NOTE — Telephone Encounter (Signed)
Called the patient with the CT results. I informed her of what each showed and educated the daughter on what spinal stenosis meant. I informed her this is typically relieved by surgery but with the patients age I m unsure if the patient would be a cantitdate. I questioned if they would like to have a referal sent to a neurosurgeon in case they would have other options. Patients daughter would like to have the referral placed. I informed her that we would send the referral off and that someone from that office should contact her. Also educated her on ways to prevent falls.

## 2018-04-17 NOTE — Telephone Encounter (Signed)
Results From [facility] on  [employer]. Thanks Hinton Dyer

## 2018-04-17 NOTE — Telephone Encounter (Addendum)
CT Lumbar spine: Has not changed since last image 01/2016. She as some arthritic changes  but no significant stenosis.   CT head: Not changed from last CT 01/2016. Unremarkable for age  CT cervical spine does show significant arthritis and moderate to severe canal stenosis (central canal) which could be pushing down on her cervical cord affecting her spinal cord and causing difficulty with ambulation, weakness. The only way to fix this is surgery and if they are interested I can refer them to a neurosurgeon. There has been no progression since 2017 which is good.  In summary, she has moderate to severe central cervical canal stenosis at c5-c6 which may be pressing on her spinal cord and causing her walking "like a drunk" problems and other symptoms. This is likely the cause of her symptoms. Unfortunately the only way to possibly fix this is surgical and she is likely not a good surgical candidate at her age. If she can't have surgery we just have to manage her symptoms, make sure she doesn't fall, utilize a wheel chair if needed.   Recommend NSY eval and if they do not think this is the case then a DAT scan was discussed with family due to her parkinsonism. thanks

## 2018-04-20 NOTE — Telephone Encounter (Signed)
Noted, Thank you Hinton Dyer.

## 2018-04-21 DIAGNOSIS — R799 Abnormal finding of blood chemistry, unspecified: Secondary | ICD-10-CM | POA: Diagnosis not present

## 2018-04-21 DIAGNOSIS — Z01812 Encounter for preprocedural laboratory examination: Secondary | ICD-10-CM | POA: Diagnosis not present

## 2018-04-22 DIAGNOSIS — M19071 Primary osteoarthritis, right ankle and foot: Secondary | ICD-10-CM | POA: Diagnosis not present

## 2018-05-05 DIAGNOSIS — I10 Essential (primary) hypertension: Secondary | ICD-10-CM | POA: Diagnosis not present

## 2018-05-11 DIAGNOSIS — I10 Essential (primary) hypertension: Secondary | ICD-10-CM | POA: Diagnosis not present

## 2018-05-11 DIAGNOSIS — R2681 Unsteadiness on feet: Secondary | ICD-10-CM | POA: Diagnosis not present

## 2018-05-14 ENCOUNTER — Telehealth: Payer: Self-pay | Admitting: Neurology

## 2018-05-14 NOTE — Telephone Encounter (Signed)
Kim Townsend: Talk to patient's sister. Patient was seen by neurosurgery who did not think the cervical stenosis was causing any of her symptoms. At last appointment we discussed if nothing else was found we would order a DAT scan to try to confirm Parkinson's Disease. If they would like to order this we are happy to do so.If I am not here please ask work-in doctor to order.  thanks

## 2018-05-15 NOTE — Telephone Encounter (Signed)
Called the patient's daughter Ms Colberg to discuss she states that it may had been discussed with her sister Lenna Sciara. I informed her that I would try and call her. I called melissa and didn't get an answer. Called Ms Pensyl, the daughter and informed her of all that was discussed and told her to discuss with her sister and the pt and to give [place] a call back. States they will call back most likely Monday to inform us whether they would like to proceed forward with DAT scan.

## 2018-05-22 DIAGNOSIS — L723 Sebaceous cyst: Secondary | ICD-10-CM | POA: Diagnosis not present

## 2018-05-25 DIAGNOSIS — L723 Sebaceous cyst: Secondary | ICD-10-CM | POA: Diagnosis not present

## 2018-05-25 DIAGNOSIS — H748X3 Other specified disorders of middle ear and mastoid, bilateral: Secondary | ICD-10-CM | POA: Diagnosis not present

## 2018-05-28 DIAGNOSIS — B351 Tinea unguium: Secondary | ICD-10-CM | POA: Diagnosis not present

## 2018-05-28 DIAGNOSIS — M79675 Pain in left toe(s): Secondary | ICD-10-CM | POA: Diagnosis not present

## 2018-05-28 DIAGNOSIS — M79674 Pain in right toe(s): Secondary | ICD-10-CM | POA: Diagnosis not present

## 2018-06-01 DIAGNOSIS — I10 Essential (primary) hypertension: Secondary | ICD-10-CM | POA: Diagnosis not present

## 2018-07-01 DIAGNOSIS — Z45010 Encounter for checking and testing of cardiac pacemaker pulse generator [battery]: Secondary | ICD-10-CM | POA: Diagnosis not present

## 2018-07-02 DIAGNOSIS — I10 Essential (primary) hypertension: Secondary | ICD-10-CM | POA: Diagnosis not present

## 2018-08-17 DIAGNOSIS — Z79899 Other long term (current) drug therapy: Secondary | ICD-10-CM | POA: Diagnosis not present

## 2018-08-17 DIAGNOSIS — H9209 Otalgia, unspecified ear: Secondary | ICD-10-CM | POA: Diagnosis not present

## 2018-08-19 DIAGNOSIS — L609 Nail disorder, unspecified: Secondary | ICD-10-CM | POA: Diagnosis not present

## 2018-08-21 DIAGNOSIS — I10 Essential (primary) hypertension: Secondary | ICD-10-CM | POA: Diagnosis not present

## 2018-09-03 DIAGNOSIS — M79674 Pain in right toe(s): Secondary | ICD-10-CM | POA: Diagnosis not present

## 2018-09-03 DIAGNOSIS — H9190 Unspecified hearing loss, unspecified ear: Secondary | ICD-10-CM | POA: Diagnosis not present

## 2018-09-03 DIAGNOSIS — R221 Localized swelling, mass and lump, neck: Secondary | ICD-10-CM | POA: Diagnosis not present

## 2018-09-03 DIAGNOSIS — B351 Tinea unguium: Secondary | ICD-10-CM | POA: Diagnosis not present

## 2018-09-03 DIAGNOSIS — H61891 Other specified disorders of right external ear: Secondary | ICD-10-CM | POA: Diagnosis not present

## 2018-09-03 DIAGNOSIS — H9319 Tinnitus, unspecified ear: Secondary | ICD-10-CM | POA: Diagnosis not present

## 2018-09-03 DIAGNOSIS — D490 Neoplasm of unspecified behavior of digestive system: Secondary | ICD-10-CM | POA: Diagnosis not present

## 2018-09-03 DIAGNOSIS — M79675 Pain in left toe(s): Secondary | ICD-10-CM | POA: Diagnosis not present

## 2018-09-10 DIAGNOSIS — E049 Nontoxic goiter, unspecified: Secondary | ICD-10-CM | POA: Diagnosis not present

## 2018-09-10 DIAGNOSIS — D110 Benign neoplasm of parotid gland: Secondary | ICD-10-CM | POA: Diagnosis not present

## 2018-09-15 DIAGNOSIS — R221 Localized swelling, mass and lump, neck: Secondary | ICD-10-CM | POA: Diagnosis not present

## 2018-09-15 DIAGNOSIS — D490 Neoplasm of unspecified behavior of digestive system: Secondary | ICD-10-CM | POA: Diagnosis not present

## 2018-09-21 DIAGNOSIS — I10 Essential (primary) hypertension: Secondary | ICD-10-CM | POA: Diagnosis not present

## 2018-09-23 DIAGNOSIS — R05 Cough: Secondary | ICD-10-CM | POA: Diagnosis not present

## 2018-10-02 DIAGNOSIS — I10 Essential (primary) hypertension: Secondary | ICD-10-CM | POA: Diagnosis not present

## 2018-10-02 DIAGNOSIS — Z7901 Long term (current) use of anticoagulants: Secondary | ICD-10-CM | POA: Diagnosis not present

## 2018-10-02 DIAGNOSIS — Z950 Presence of cardiac pacemaker: Secondary | ICD-10-CM | POA: Diagnosis not present

## 2018-10-02 DIAGNOSIS — I340 Nonrheumatic mitral (valve) insufficiency: Secondary | ICD-10-CM | POA: Diagnosis not present

## 2018-10-02 DIAGNOSIS — I480 Paroxysmal atrial fibrillation: Secondary | ICD-10-CM | POA: Diagnosis not present

## 2018-10-21 DIAGNOSIS — I10 Essential (primary) hypertension: Secondary | ICD-10-CM | POA: Diagnosis not present

## 2018-10-22 DIAGNOSIS — Z45018 Encounter for adjustment and management of other part of cardiac pacemaker: Secondary | ICD-10-CM | POA: Diagnosis not present

## 2018-10-22 DIAGNOSIS — I495 Sick sinus syndrome: Secondary | ICD-10-CM | POA: Diagnosis not present

## 2018-11-05 DIAGNOSIS — D37030 Neoplasm of uncertain behavior of the parotid salivary glands: Secondary | ICD-10-CM | POA: Diagnosis not present

## 2018-11-05 DIAGNOSIS — D110 Benign neoplasm of parotid gland: Secondary | ICD-10-CM | POA: Diagnosis not present

## 2018-11-05 DIAGNOSIS — K118 Other diseases of salivary glands: Secondary | ICD-10-CM | POA: Diagnosis not present

## 2018-11-11 ENCOUNTER — Telehealth: Payer: Self-pay | Admitting: Neurology

## 2018-11-11 DIAGNOSIS — I10 Essential (primary) hypertension: Secondary | ICD-10-CM | POA: Diagnosis not present

## 2018-11-11 NOTE — Telephone Encounter (Signed)
Eulas Post with pts PCP called stating they are wanting to put the pt on aricept, but wanting to make sure the past office visits did not show any concern for the pt starting medication. Call back [phone]

## 2018-11-12 NOTE — Telephone Encounter (Signed)
Spoke with Eulas Post and discussed that Dr. Jaynee Eagles evaluated patient for dizziness. She has no problem with starting aricept from a neurologic perspecitive but Aricept can have other contraindications and side effects such as cardiac side effects and so Dr. Jaynee Eagles would advise for them to ensure there are no other contraindications or cardiac abnormalities before placing on Aricept. They will monitor patient. Eulas Post verbalized understanding and appreciation for Dr. Cathren Laine message.

## 2018-11-12 NOTE — Telephone Encounter (Signed)
I evaluated patient for dizziness. I have no problem with starting aricept from a neurologic perspecitive but Aricept can have other contraindications and side effects such as cardiac side effects and so I would advice ensuring there are no other contraindications or cardiac abnormalities before placing on Aricept.

## 2018-11-20 DIAGNOSIS — I10 Essential (primary) hypertension: Secondary | ICD-10-CM | POA: Diagnosis not present

## 2018-11-20 DIAGNOSIS — R221 Localized swelling, mass and lump, neck: Secondary | ICD-10-CM | POA: Diagnosis not present

## 2018-11-20 DIAGNOSIS — D490 Neoplasm of unspecified behavior of digestive system: Secondary | ICD-10-CM | POA: Diagnosis not present

## 2018-11-28 DIAGNOSIS — M5489 Other dorsalgia: Secondary | ICD-10-CM | POA: Diagnosis not present

## 2018-11-28 DIAGNOSIS — M436 Torticollis: Secondary | ICD-10-CM | POA: Diagnosis not present

## 2018-11-28 DIAGNOSIS — I517 Cardiomegaly: Secondary | ICD-10-CM | POA: Diagnosis not present

## 2018-11-28 DIAGNOSIS — R079 Chest pain, unspecified: Secondary | ICD-10-CM | POA: Diagnosis not present

## 2018-11-28 DIAGNOSIS — M542 Cervicalgia: Secondary | ICD-10-CM | POA: Diagnosis not present

## 2018-11-28 DIAGNOSIS — R9431 Abnormal electrocardiogram [ECG] [EKG]: Secondary | ICD-10-CM | POA: Diagnosis not present

## 2018-11-29 DIAGNOSIS — R9431 Abnormal electrocardiogram [ECG] [EKG]: Secondary | ICD-10-CM | POA: Diagnosis not present

## 2018-11-29 DIAGNOSIS — I517 Cardiomegaly: Secondary | ICD-10-CM | POA: Diagnosis not present

## 2018-12-08 DIAGNOSIS — M79674 Pain in right toe(s): Secondary | ICD-10-CM | POA: Diagnosis not present

## 2018-12-08 DIAGNOSIS — B351 Tinea unguium: Secondary | ICD-10-CM | POA: Diagnosis not present

## 2018-12-08 DIAGNOSIS — M79675 Pain in left toe(s): Secondary | ICD-10-CM | POA: Diagnosis not present

## 2018-12-09 DIAGNOSIS — Z13220 Encounter for screening for lipoid disorders: Secondary | ICD-10-CM | POA: Diagnosis not present

## 2018-12-09 DIAGNOSIS — R5383 Other fatigue: Secondary | ICD-10-CM | POA: Diagnosis not present

## 2018-12-09 DIAGNOSIS — I10 Essential (primary) hypertension: Secondary | ICD-10-CM | POA: Diagnosis not present

## 2018-12-22 DIAGNOSIS — I10 Essential (primary) hypertension: Secondary | ICD-10-CM | POA: Diagnosis not present

## 2019-01-04 DIAGNOSIS — M7522 Bicipital tendinitis, left shoulder: Secondary | ICD-10-CM | POA: Diagnosis not present

## 2019-01-04 DIAGNOSIS — M25512 Pain in left shoulder: Secondary | ICD-10-CM | POA: Diagnosis not present

## 2019-01-04 DIAGNOSIS — M79602 Pain in left arm: Secondary | ICD-10-CM | POA: Diagnosis not present

## 2019-01-04 DIAGNOSIS — M19042 Primary osteoarthritis, left hand: Secondary | ICD-10-CM | POA: Diagnosis not present

## 2019-01-04 DIAGNOSIS — M19012 Primary osteoarthritis, left shoulder: Secondary | ICD-10-CM | POA: Diagnosis not present

## 2019-01-22 DIAGNOSIS — I10 Essential (primary) hypertension: Secondary | ICD-10-CM | POA: Diagnosis not present

## 2019-02-05 DIAGNOSIS — J309 Allergic rhinitis, unspecified: Secondary | ICD-10-CM | POA: Diagnosis not present

## 2019-02-05 DIAGNOSIS — E876 Hypokalemia: Secondary | ICD-10-CM | POA: Diagnosis not present

## 2019-02-05 DIAGNOSIS — I10 Essential (primary) hypertension: Secondary | ICD-10-CM | POA: Diagnosis not present

## 2019-02-19 DIAGNOSIS — I10 Essential (primary) hypertension: Secondary | ICD-10-CM | POA: Diagnosis not present

## 2019-02-24 ENCOUNTER — Ambulatory Visit: Payer: Medicare Other | Admitting: Neurology

## 2019-02-24 DIAGNOSIS — R609 Edema, unspecified: Secondary | ICD-10-CM | POA: Diagnosis not present

## 2019-02-24 DIAGNOSIS — H9209 Otalgia, unspecified ear: Secondary | ICD-10-CM | POA: Diagnosis not present

## 2019-03-08 ENCOUNTER — Ambulatory Visit: Payer: Medicare Other | Admitting: Neurology

## 2019-05-04 DIAGNOSIS — Z950 Presence of cardiac pacemaker: Secondary | ICD-10-CM | POA: Diagnosis not present

## 2019-05-07 DIAGNOSIS — I10 Essential (primary) hypertension: Secondary | ICD-10-CM | POA: Diagnosis not present

## 2019-05-11 ENCOUNTER — Telehealth: Payer: Self-pay | Admitting: *Deleted

## 2019-05-11 NOTE — Telephone Encounter (Signed)
Spoke with pt's daughter Kim Townsend. She stated they were not comfortable coming in right now. Preferred to r/s for July afternoon appt. Pt r/s to Tues 06/09/2019 @ 2:00 pm anticipate in-office visit. 6/10 appt canceled. She verbalized appreciation for the call.

## 2019-05-11 NOTE — Telephone Encounter (Signed)
I called pt's daughter Lenna Sciara Q. (on DPR) and LVM asking for call back regarding pt's appt tomorrow 6/10 @ 3:00 pm. Asked if she was comfortable bringing the patient in tomorrow. Advised mask is required and covid19 screening questions will be asked. Asked for cb back by this afternoon or we will need to cancel appt and have her r/s. Also advised I would reach out again later today. Left office number in message.

## 2019-05-11 NOTE — Telephone Encounter (Signed)
Tried to reach pt's daughter Faylene Million, again. Received busy signal.

## 2019-05-12 ENCOUNTER — Ambulatory Visit: Payer: Medicare Other | Admitting: Neurology

## 2019-05-12 ENCOUNTER — Encounter

## 2019-06-01 DIAGNOSIS — R600 Localized edema: Secondary | ICD-10-CM | POA: Diagnosis not present

## 2019-06-01 DIAGNOSIS — M79675 Pain in left toe(s): Secondary | ICD-10-CM | POA: Diagnosis not present

## 2019-06-01 DIAGNOSIS — B351 Tinea unguium: Secondary | ICD-10-CM | POA: Diagnosis not present

## 2019-06-01 DIAGNOSIS — M79674 Pain in right toe(s): Secondary | ICD-10-CM | POA: Diagnosis not present

## 2019-06-08 ENCOUNTER — Telehealth: Payer: Self-pay

## 2019-06-08 NOTE — Telephone Encounter (Signed)
Pts daughter Lenna Sciara  call to cancel appt for her mom.She will call back to r/s.

## 2019-06-09 ENCOUNTER — Ambulatory Visit: Payer: Self-pay | Admitting: Neurology

## 2019-06-09 DIAGNOSIS — Z13220 Encounter for screening for lipoid disorders: Secondary | ICD-10-CM | POA: Diagnosis not present

## 2019-06-09 DIAGNOSIS — Z6824 Body mass index (BMI) 24.0-24.9, adult: Secondary | ICD-10-CM | POA: Diagnosis not present

## 2019-06-09 DIAGNOSIS — I10 Essential (primary) hypertension: Secondary | ICD-10-CM | POA: Diagnosis not present

## 2019-06-09 DIAGNOSIS — R609 Edema, unspecified: Secondary | ICD-10-CM | POA: Diagnosis not present

## 2019-06-14 DIAGNOSIS — R609 Edema, unspecified: Secondary | ICD-10-CM | POA: Diagnosis not present

## 2019-06-14 DIAGNOSIS — R799 Abnormal finding of blood chemistry, unspecified: Secondary | ICD-10-CM | POA: Diagnosis not present

## 2019-06-14 DIAGNOSIS — Z9119 Patient's noncompliance with other medical treatment and regimen: Secondary | ICD-10-CM | POA: Diagnosis not present

## 2019-06-21 DIAGNOSIS — R799 Abnormal finding of blood chemistry, unspecified: Secondary | ICD-10-CM | POA: Diagnosis not present

## 2019-06-21 DIAGNOSIS — R609 Edema, unspecified: Secondary | ICD-10-CM | POA: Diagnosis not present

## 2019-06-22 DIAGNOSIS — I10 Essential (primary) hypertension: Secondary | ICD-10-CM | POA: Diagnosis not present

## 2019-06-25 DIAGNOSIS — R609 Edema, unspecified: Secondary | ICD-10-CM | POA: Diagnosis not present

## 2019-06-25 DIAGNOSIS — Z6824 Body mass index (BMI) 24.0-24.9, adult: Secondary | ICD-10-CM | POA: Diagnosis not present

## 2019-06-25 DIAGNOSIS — I10 Essential (primary) hypertension: Secondary | ICD-10-CM | POA: Diagnosis not present

## 2019-06-25 DIAGNOSIS — R799 Abnormal finding of blood chemistry, unspecified: Secondary | ICD-10-CM | POA: Diagnosis not present

## 2019-07-13 DIAGNOSIS — R609 Edema, unspecified: Secondary | ICD-10-CM | POA: Diagnosis not present

## 2019-07-13 DIAGNOSIS — I10 Essential (primary) hypertension: Secondary | ICD-10-CM | POA: Diagnosis not present

## 2019-08-23 DIAGNOSIS — I10 Essential (primary) hypertension: Secondary | ICD-10-CM | POA: Diagnosis not present

## 2019-09-03 DIAGNOSIS — I10 Essential (primary) hypertension: Secondary | ICD-10-CM | POA: Diagnosis not present

## 2019-09-03 DIAGNOSIS — Z950 Presence of cardiac pacemaker: Secondary | ICD-10-CM | POA: Diagnosis not present

## 2019-09-03 DIAGNOSIS — I480 Paroxysmal atrial fibrillation: Secondary | ICD-10-CM | POA: Diagnosis not present

## 2019-09-03 DIAGNOSIS — Z7901 Long term (current) use of anticoagulants: Secondary | ICD-10-CM | POA: Diagnosis not present

## 2019-09-03 DIAGNOSIS — E210 Primary hyperparathyroidism: Secondary | ICD-10-CM | POA: Diagnosis not present

## 2019-09-21 DIAGNOSIS — I10 Essential (primary) hypertension: Secondary | ICD-10-CM | POA: Diagnosis not present

## 2019-09-23 DIAGNOSIS — M79675 Pain in left toe(s): Secondary | ICD-10-CM | POA: Diagnosis not present

## 2019-09-23 DIAGNOSIS — M79674 Pain in right toe(s): Secondary | ICD-10-CM | POA: Diagnosis not present

## 2019-09-23 DIAGNOSIS — B351 Tinea unguium: Secondary | ICD-10-CM | POA: Diagnosis not present

## 2019-10-22 DIAGNOSIS — I10 Essential (primary) hypertension: Secondary | ICD-10-CM | POA: Diagnosis not present

## 2019-11-01 DIAGNOSIS — R5383 Other fatigue: Secondary | ICD-10-CM | POA: Diagnosis not present

## 2019-11-01 DIAGNOSIS — R609 Edema, unspecified: Secondary | ICD-10-CM | POA: Diagnosis not present

## 2019-11-01 DIAGNOSIS — R8290 Unspecified abnormal findings in urine: Secondary | ICD-10-CM | POA: Diagnosis not present

## 2019-11-01 DIAGNOSIS — D513 Other dietary vitamin B12 deficiency anemia: Secondary | ICD-10-CM | POA: Diagnosis not present

## 2019-11-01 DIAGNOSIS — E539 Vitamin B deficiency, unspecified: Secondary | ICD-10-CM | POA: Diagnosis not present

## 2019-11-01 DIAGNOSIS — D110 Benign neoplasm of parotid gland: Secondary | ICD-10-CM | POA: Diagnosis not present

## 2019-11-01 DIAGNOSIS — I10 Essential (primary) hypertension: Secondary | ICD-10-CM | POA: Diagnosis not present

## 2019-11-03 DIAGNOSIS — D490 Neoplasm of unspecified behavior of digestive system: Secondary | ICD-10-CM | POA: Diagnosis not present

## 2019-11-03 DIAGNOSIS — C07 Malignant neoplasm of parotid gland: Secondary | ICD-10-CM | POA: Diagnosis not present

## 2019-11-03 DIAGNOSIS — D110 Benign neoplasm of parotid gland: Secondary | ICD-10-CM | POA: Diagnosis not present

## 2019-11-10 DIAGNOSIS — K118 Other diseases of salivary glands: Secondary | ICD-10-CM | POA: Diagnosis not present

## 2019-11-10 DIAGNOSIS — D490 Neoplasm of unspecified behavior of digestive system: Secondary | ICD-10-CM | POA: Diagnosis not present

## 2019-11-19 DIAGNOSIS — R413 Other amnesia: Secondary | ICD-10-CM | POA: Diagnosis not present

## 2019-11-19 DIAGNOSIS — D649 Anemia, unspecified: Secondary | ICD-10-CM | POA: Diagnosis not present

## 2019-11-22 DIAGNOSIS — I10 Essential (primary) hypertension: Secondary | ICD-10-CM | POA: Diagnosis not present

## 2019-11-23 DIAGNOSIS — L299 Pruritus, unspecified: Secondary | ICD-10-CM | POA: Diagnosis not present

## 2019-11-23 DIAGNOSIS — D513 Other dietary vitamin B12 deficiency anemia: Secondary | ICD-10-CM | POA: Diagnosis not present

## 2019-11-23 DIAGNOSIS — G3184 Mild cognitive impairment, so stated: Secondary | ICD-10-CM | POA: Diagnosis not present

## 2019-11-23 DIAGNOSIS — R609 Edema, unspecified: Secondary | ICD-10-CM | POA: Diagnosis not present

## 2019-11-23 DIAGNOSIS — E539 Vitamin B deficiency, unspecified: Secondary | ICD-10-CM | POA: Diagnosis not present

## 2019-12-16 DIAGNOSIS — I495 Sick sinus syndrome: Secondary | ICD-10-CM | POA: Diagnosis not present

## 2019-12-16 DIAGNOSIS — Z45018 Encounter for adjustment and management of other part of cardiac pacemaker: Secondary | ICD-10-CM | POA: Diagnosis not present

## 2019-12-24 DIAGNOSIS — I10 Essential (primary) hypertension: Secondary | ICD-10-CM | POA: Diagnosis not present

## 2020-01-11 DIAGNOSIS — B351 Tinea unguium: Secondary | ICD-10-CM | POA: Diagnosis not present

## 2020-01-11 DIAGNOSIS — M79675 Pain in left toe(s): Secondary | ICD-10-CM | POA: Diagnosis not present

## 2020-01-11 DIAGNOSIS — M79674 Pain in right toe(s): Secondary | ICD-10-CM | POA: Diagnosis not present

## 2020-01-24 DIAGNOSIS — I10 Essential (primary) hypertension: Secondary | ICD-10-CM | POA: Diagnosis not present

## 2020-02-15 DIAGNOSIS — R609 Edema, unspecified: Secondary | ICD-10-CM | POA: Diagnosis not present

## 2020-02-15 DIAGNOSIS — I10 Essential (primary) hypertension: Secondary | ICD-10-CM | POA: Diagnosis not present

## 2020-02-15 DIAGNOSIS — D513 Other dietary vitamin B12 deficiency anemia: Secondary | ICD-10-CM | POA: Diagnosis not present

## 2020-02-15 DIAGNOSIS — I872 Venous insufficiency (chronic) (peripheral): Secondary | ICD-10-CM | POA: Diagnosis not present

## 2020-02-21 DIAGNOSIS — I10 Essential (primary) hypertension: Secondary | ICD-10-CM | POA: Diagnosis not present

## 2020-02-24 DIAGNOSIS — I081 Rheumatic disorders of both mitral and tricuspid valves: Secondary | ICD-10-CM | POA: Diagnosis not present

## 2020-02-24 DIAGNOSIS — R069 Unspecified abnormalities of breathing: Secondary | ICD-10-CM | POA: Diagnosis not present

## 2020-02-24 DIAGNOSIS — I10 Essential (primary) hypertension: Secondary | ICD-10-CM | POA: Diagnosis not present

## 2020-02-24 DIAGNOSIS — I361 Nonrheumatic tricuspid (valve) insufficiency: Secondary | ICD-10-CM | POA: Diagnosis not present

## 2020-02-24 DIAGNOSIS — I340 Nonrheumatic mitral (valve) insufficiency: Secondary | ICD-10-CM | POA: Diagnosis not present

## 2020-03-16 DIAGNOSIS — G3184 Mild cognitive impairment, so stated: Secondary | ICD-10-CM | POA: Diagnosis not present

## 2020-03-16 DIAGNOSIS — L309 Dermatitis, unspecified: Secondary | ICD-10-CM | POA: Diagnosis not present

## 2020-03-16 DIAGNOSIS — E539 Vitamin B deficiency, unspecified: Secondary | ICD-10-CM | POA: Diagnosis not present

## 2020-03-16 DIAGNOSIS — R609 Edema, unspecified: Secondary | ICD-10-CM | POA: Diagnosis not present

## 2020-03-20 DIAGNOSIS — I161 Hypertensive emergency: Secondary | ICD-10-CM | POA: Diagnosis not present

## 2020-03-20 DIAGNOSIS — R7989 Other specified abnormal findings of blood chemistry: Secondary | ICD-10-CM | POA: Diagnosis not present

## 2020-03-20 DIAGNOSIS — Z743 Need for continuous supervision: Secondary | ICD-10-CM | POA: Diagnosis not present

## 2020-03-20 DIAGNOSIS — Z7901 Long term (current) use of anticoagulants: Secondary | ICD-10-CM | POA: Diagnosis not present

## 2020-03-20 DIAGNOSIS — R519 Headache, unspecified: Secondary | ICD-10-CM | POA: Diagnosis not present

## 2020-03-20 DIAGNOSIS — R410 Disorientation, unspecified: Secondary | ICD-10-CM | POA: Diagnosis not present

## 2020-03-20 DIAGNOSIS — R0602 Shortness of breath: Secondary | ICD-10-CM | POA: Diagnosis not present

## 2020-03-20 DIAGNOSIS — G44209 Tension-type headache, unspecified, not intractable: Secondary | ICD-10-CM | POA: Diagnosis not present

## 2020-03-20 DIAGNOSIS — Z79899 Other long term (current) drug therapy: Secondary | ICD-10-CM | POA: Diagnosis not present

## 2020-03-20 DIAGNOSIS — Z950 Presence of cardiac pacemaker: Secondary | ICD-10-CM | POA: Diagnosis not present

## 2020-03-20 DIAGNOSIS — R069 Unspecified abnormalities of breathing: Secondary | ICD-10-CM | POA: Diagnosis not present

## 2020-03-20 DIAGNOSIS — I480 Paroxysmal atrial fibrillation: Secondary | ICD-10-CM | POA: Diagnosis not present

## 2020-03-20 DIAGNOSIS — R748 Abnormal levels of other serum enzymes: Secondary | ICD-10-CM | POA: Diagnosis not present

## 2020-03-20 DIAGNOSIS — I160 Hypertensive urgency: Secondary | ICD-10-CM | POA: Diagnosis not present

## 2020-03-20 DIAGNOSIS — E213 Hyperparathyroidism, unspecified: Secondary | ICD-10-CM | POA: Diagnosis not present

## 2020-03-20 DIAGNOSIS — L819 Disorder of pigmentation, unspecified: Secondary | ICD-10-CM | POA: Diagnosis not present

## 2020-03-20 DIAGNOSIS — I10 Essential (primary) hypertension: Secondary | ICD-10-CM | POA: Diagnosis not present

## 2020-03-20 DIAGNOSIS — R0600 Dyspnea, unspecified: Secondary | ICD-10-CM | POA: Diagnosis not present

## 2020-03-20 DIAGNOSIS — J90 Pleural effusion, not elsewhere classified: Secondary | ICD-10-CM | POA: Diagnosis not present

## 2020-03-20 DIAGNOSIS — I081 Rheumatic disorders of both mitral and tricuspid valves: Secondary | ICD-10-CM | POA: Diagnosis not present

## 2020-03-21 DIAGNOSIS — I081 Rheumatic disorders of both mitral and tricuspid valves: Secondary | ICD-10-CM | POA: Diagnosis not present

## 2020-03-21 DIAGNOSIS — I10 Essential (primary) hypertension: Secondary | ICD-10-CM | POA: Diagnosis not present

## 2020-03-21 DIAGNOSIS — I4891 Unspecified atrial fibrillation: Secondary | ICD-10-CM | POA: Diagnosis not present

## 2020-03-21 DIAGNOSIS — I371 Nonrheumatic pulmonary valve insufficiency: Secondary | ICD-10-CM | POA: Diagnosis not present

## 2020-03-21 DIAGNOSIS — I272 Pulmonary hypertension, unspecified: Secondary | ICD-10-CM | POA: Diagnosis not present

## 2020-03-21 DIAGNOSIS — I161 Hypertensive emergency: Secondary | ICD-10-CM | POA: Diagnosis not present

## 2020-03-23 DIAGNOSIS — Z950 Presence of cardiac pacemaker: Secondary | ICD-10-CM | POA: Diagnosis not present

## 2020-03-29 DIAGNOSIS — Z6824 Body mass index (BMI) 24.0-24.9, adult: Secondary | ICD-10-CM | POA: Diagnosis not present

## 2020-03-29 DIAGNOSIS — D513 Other dietary vitamin B12 deficiency anemia: Secondary | ICD-10-CM | POA: Diagnosis not present

## 2020-03-29 DIAGNOSIS — I10 Essential (primary) hypertension: Secondary | ICD-10-CM | POA: Diagnosis not present

## 2020-04-20 DIAGNOSIS — I10 Essential (primary) hypertension: Secondary | ICD-10-CM | POA: Diagnosis not present

## 2020-05-02 ENCOUNTER — Ambulatory Visit: Payer: Self-pay | Admitting: Internal Medicine

## 2020-05-16 DIAGNOSIS — E539 Vitamin B deficiency, unspecified: Secondary | ICD-10-CM | POA: Diagnosis not present

## 2020-05-16 DIAGNOSIS — D513 Other dietary vitamin B12 deficiency anemia: Secondary | ICD-10-CM | POA: Diagnosis not present

## 2020-05-16 DIAGNOSIS — R609 Edema, unspecified: Secondary | ICD-10-CM | POA: Diagnosis not present

## 2020-05-16 DIAGNOSIS — G3184 Mild cognitive impairment, so stated: Secondary | ICD-10-CM | POA: Diagnosis not present

## 2020-05-22 DIAGNOSIS — I10 Essential (primary) hypertension: Secondary | ICD-10-CM | POA: Diagnosis not present

## 2020-05-22 DIAGNOSIS — G4452 New daily persistent headache (NDPH): Secondary | ICD-10-CM | POA: Diagnosis not present

## 2020-05-23 DIAGNOSIS — B351 Tinea unguium: Secondary | ICD-10-CM | POA: Diagnosis not present

## 2020-05-23 DIAGNOSIS — M79675 Pain in left toe(s): Secondary | ICD-10-CM | POA: Diagnosis not present

## 2020-05-23 DIAGNOSIS — M79674 Pain in right toe(s): Secondary | ICD-10-CM | POA: Diagnosis not present

## 2020-06-12 DIAGNOSIS — G4452 New daily persistent headache (NDPH): Secondary | ICD-10-CM | POA: Diagnosis not present

## 2020-06-19 DIAGNOSIS — I10 Essential (primary) hypertension: Secondary | ICD-10-CM | POA: Diagnosis not present

## 2020-07-06 DIAGNOSIS — H61892 Other specified disorders of left external ear: Secondary | ICD-10-CM | POA: Diagnosis not present

## 2020-07-06 DIAGNOSIS — G3184 Mild cognitive impairment, so stated: Secondary | ICD-10-CM | POA: Diagnosis not present

## 2020-07-06 DIAGNOSIS — Z45018 Encounter for adjustment and management of other part of cardiac pacemaker: Secondary | ICD-10-CM | POA: Diagnosis not present

## 2020-07-06 DIAGNOSIS — D513 Other dietary vitamin B12 deficiency anemia: Secondary | ICD-10-CM | POA: Diagnosis not present

## 2020-07-06 DIAGNOSIS — R519 Headache, unspecified: Secondary | ICD-10-CM | POA: Diagnosis not present

## 2020-07-17 DIAGNOSIS — Z974 Presence of external hearing-aid: Secondary | ICD-10-CM | POA: Diagnosis not present

## 2020-07-17 DIAGNOSIS — K118 Other diseases of salivary glands: Secondary | ICD-10-CM | POA: Diagnosis not present

## 2020-07-17 DIAGNOSIS — H9190 Unspecified hearing loss, unspecified ear: Secondary | ICD-10-CM | POA: Diagnosis not present

## 2020-07-17 DIAGNOSIS — R519 Headache, unspecified: Secondary | ICD-10-CM | POA: Diagnosis not present

## 2020-07-17 DIAGNOSIS — R221 Localized swelling, mass and lump, neck: Secondary | ICD-10-CM | POA: Diagnosis not present

## 2020-07-21 DIAGNOSIS — I6789 Other cerebrovascular disease: Secondary | ICD-10-CM | POA: Diagnosis not present

## 2020-07-21 DIAGNOSIS — Z743 Need for continuous supervision: Secondary | ICD-10-CM | POA: Diagnosis not present

## 2020-07-21 DIAGNOSIS — Z950 Presence of cardiac pacemaker: Secondary | ICD-10-CM | POA: Diagnosis not present

## 2020-07-21 DIAGNOSIS — R682 Dry mouth, unspecified: Secondary | ICD-10-CM | POA: Diagnosis not present

## 2020-07-21 DIAGNOSIS — G43009 Migraine without aura, not intractable, without status migrainosus: Secondary | ICD-10-CM | POA: Diagnosis not present

## 2020-07-21 DIAGNOSIS — G4489 Other headache syndrome: Secondary | ICD-10-CM | POA: Diagnosis not present

## 2020-07-21 DIAGNOSIS — R6889 Other general symptoms and signs: Secondary | ICD-10-CM | POA: Diagnosis not present

## 2020-07-21 DIAGNOSIS — R599 Enlarged lymph nodes, unspecified: Secondary | ICD-10-CM | POA: Diagnosis not present

## 2020-07-21 DIAGNOSIS — I10 Essential (primary) hypertension: Secondary | ICD-10-CM | POA: Diagnosis not present

## 2020-07-21 DIAGNOSIS — R519 Headache, unspecified: Secondary | ICD-10-CM | POA: Diagnosis not present

## 2020-07-25 DIAGNOSIS — G4452 New daily persistent headache (NDPH): Secondary | ICD-10-CM | POA: Diagnosis not present

## 2020-07-25 DIAGNOSIS — I10 Essential (primary) hypertension: Secondary | ICD-10-CM | POA: Diagnosis not present

## 2020-07-25 DIAGNOSIS — D513 Other dietary vitamin B12 deficiency anemia: Secondary | ICD-10-CM | POA: Diagnosis not present

## 2020-07-26 DIAGNOSIS — J90 Pleural effusion, not elsewhere classified: Secondary | ICD-10-CM | POA: Diagnosis not present

## 2020-07-26 DIAGNOSIS — I7090 Unspecified atherosclerosis: Secondary | ICD-10-CM | POA: Diagnosis not present

## 2020-07-26 DIAGNOSIS — K118 Other diseases of salivary glands: Secondary | ICD-10-CM | POA: Diagnosis not present

## 2020-07-26 DIAGNOSIS — C07 Malignant neoplasm of parotid gland: Secondary | ICD-10-CM | POA: Diagnosis not present

## 2020-08-01 DIAGNOSIS — L720 Epidermal cyst: Secondary | ICD-10-CM | POA: Diagnosis not present

## 2020-08-03 DIAGNOSIS — D110 Benign neoplasm of parotid gland: Secondary | ICD-10-CM | POA: Diagnosis not present

## 2020-08-21 DIAGNOSIS — I10 Essential (primary) hypertension: Secondary | ICD-10-CM | POA: Diagnosis not present

## 2020-08-24 DIAGNOSIS — D513 Other dietary vitamin B12 deficiency anemia: Secondary | ICD-10-CM | POA: Diagnosis not present

## 2020-09-04 ENCOUNTER — Ambulatory Visit: Payer: Self-pay | Admitting: Internal Medicine

## 2020-09-05 DIAGNOSIS — Z45018 Encounter for adjustment and management of other part of cardiac pacemaker: Secondary | ICD-10-CM | POA: Diagnosis not present

## 2020-09-05 DIAGNOSIS — I480 Paroxysmal atrial fibrillation: Secondary | ICD-10-CM | POA: Diagnosis not present

## 2020-09-05 DIAGNOSIS — I495 Sick sinus syndrome: Secondary | ICD-10-CM | POA: Diagnosis not present

## 2020-09-05 DIAGNOSIS — I214 Non-ST elevation (NSTEMI) myocardial infarction: Secondary | ICD-10-CM | POA: Diagnosis not present

## 2020-09-05 DIAGNOSIS — I10 Essential (primary) hypertension: Secondary | ICD-10-CM | POA: Diagnosis not present

## 2020-09-20 DIAGNOSIS — I10 Essential (primary) hypertension: Secondary | ICD-10-CM | POA: Diagnosis not present

## 2020-10-05 DIAGNOSIS — G3184 Mild cognitive impairment, so stated: Secondary | ICD-10-CM | POA: Diagnosis not present

## 2020-10-05 DIAGNOSIS — R519 Headache, unspecified: Secondary | ICD-10-CM | POA: Diagnosis not present

## 2020-10-05 DIAGNOSIS — D513 Other dietary vitamin B12 deficiency anemia: Secondary | ICD-10-CM | POA: Diagnosis not present

## 2020-10-05 DIAGNOSIS — I10 Essential (primary) hypertension: Secondary | ICD-10-CM | POA: Diagnosis not present

## 2020-10-05 DIAGNOSIS — R634 Abnormal weight loss: Secondary | ICD-10-CM | POA: Diagnosis not present

## 2020-10-24 DIAGNOSIS — I10 Essential (primary) hypertension: Secondary | ICD-10-CM | POA: Diagnosis not present

## 2020-11-16 DIAGNOSIS — R519 Headache, unspecified: Secondary | ICD-10-CM | POA: Diagnosis not present

## 2020-11-21 DIAGNOSIS — I10 Essential (primary) hypertension: Secondary | ICD-10-CM | POA: Diagnosis not present

## 2020-12-21 DIAGNOSIS — I10 Essential (primary) hypertension: Secondary | ICD-10-CM | POA: Diagnosis not present

## 2020-12-28 DIAGNOSIS — Z45018 Encounter for adjustment and management of other part of cardiac pacemaker: Secondary | ICD-10-CM | POA: Diagnosis not present

## 2020-12-28 DIAGNOSIS — I495 Sick sinus syndrome: Secondary | ICD-10-CM | POA: Diagnosis not present

## 2021-01-04 DIAGNOSIS — D513 Other dietary vitamin B12 deficiency anemia: Secondary | ICD-10-CM | POA: Diagnosis not present

## 2021-01-04 DIAGNOSIS — I10 Essential (primary) hypertension: Secondary | ICD-10-CM | POA: Diagnosis not present

## 2021-01-04 DIAGNOSIS — R519 Headache, unspecified: Secondary | ICD-10-CM | POA: Diagnosis not present

## 2021-01-04 DIAGNOSIS — G3184 Mild cognitive impairment, so stated: Secondary | ICD-10-CM | POA: Diagnosis not present

## 2021-01-22 DIAGNOSIS — I10 Essential (primary) hypertension: Secondary | ICD-10-CM | POA: Diagnosis not present

## 2021-01-23 DIAGNOSIS — N390 Urinary tract infection, site not specified: Secondary | ICD-10-CM | POA: Diagnosis not present

## 2021-01-23 DIAGNOSIS — R519 Headache, unspecified: Secondary | ICD-10-CM | POA: Diagnosis not present

## 2021-01-23 DIAGNOSIS — B9689 Other specified bacterial agents as the cause of diseases classified elsewhere: Secondary | ICD-10-CM | POA: Diagnosis not present

## 2021-01-23 DIAGNOSIS — R404 Transient alteration of awareness: Secondary | ICD-10-CM | POA: Diagnosis not present

## 2021-01-23 DIAGNOSIS — Z20822 Contact with and (suspected) exposure to covid-19: Secondary | ICD-10-CM | POA: Diagnosis not present

## 2021-01-23 DIAGNOSIS — R9431 Abnormal electrocardiogram [ECG] [EKG]: Secondary | ICD-10-CM | POA: Diagnosis not present

## 2021-01-23 DIAGNOSIS — Z950 Presence of cardiac pacemaker: Secondary | ICD-10-CM | POA: Diagnosis not present

## 2021-01-23 DIAGNOSIS — R531 Weakness: Secondary | ICD-10-CM | POA: Diagnosis not present

## 2021-01-23 DIAGNOSIS — I4589 Other specified conduction disorders: Secondary | ICD-10-CM | POA: Diagnosis not present

## 2021-01-29 DIAGNOSIS — I959 Hypotension, unspecified: Secondary | ICD-10-CM | POA: Diagnosis not present

## 2021-02-06 DIAGNOSIS — D513 Other dietary vitamin B12 deficiency anemia: Secondary | ICD-10-CM | POA: Diagnosis not present

## 2021-02-06 DIAGNOSIS — G3184 Mild cognitive impairment, so stated: Secondary | ICD-10-CM | POA: Diagnosis not present

## 2021-02-06 DIAGNOSIS — E860 Dehydration: Secondary | ICD-10-CM | POA: Diagnosis not present

## 2021-02-20 DIAGNOSIS — R531 Weakness: Secondary | ICD-10-CM | POA: Diagnosis not present

## 2021-02-20 DIAGNOSIS — R519 Headache, unspecified: Secondary | ICD-10-CM | POA: Diagnosis not present

## 2021-02-20 DIAGNOSIS — R609 Edema, unspecified: Secondary | ICD-10-CM | POA: Diagnosis not present

## 2021-02-20 DIAGNOSIS — I959 Hypotension, unspecified: Secondary | ICD-10-CM | POA: Diagnosis not present

## 2021-02-20 DIAGNOSIS — R6889 Other general symptoms and signs: Secondary | ICD-10-CM | POA: Diagnosis not present

## 2021-02-20 DIAGNOSIS — Z743 Need for continuous supervision: Secondary | ICD-10-CM | POA: Diagnosis not present

## 2021-02-20 DIAGNOSIS — R404 Transient alteration of awareness: Secondary | ICD-10-CM | POA: Diagnosis not present

## 2021-02-20 DIAGNOSIS — I10 Essential (primary) hypertension: Secondary | ICD-10-CM | POA: Diagnosis not present

## 2021-02-20 DIAGNOSIS — E860 Dehydration: Secondary | ICD-10-CM | POA: Diagnosis not present

## 2021-02-27 DIAGNOSIS — I959 Hypotension, unspecified: Secondary | ICD-10-CM | POA: Diagnosis not present

## 2021-02-27 DIAGNOSIS — E860 Dehydration: Secondary | ICD-10-CM | POA: Diagnosis not present

## 2021-02-27 DIAGNOSIS — R799 Abnormal finding of blood chemistry, unspecified: Secondary | ICD-10-CM | POA: Diagnosis not present

## 2021-03-21 DIAGNOSIS — I10 Essential (primary) hypertension: Secondary | ICD-10-CM | POA: Diagnosis not present

## 2021-04-03 DIAGNOSIS — M79674 Pain in right toe(s): Secondary | ICD-10-CM | POA: Diagnosis not present

## 2021-04-03 DIAGNOSIS — B351 Tinea unguium: Secondary | ICD-10-CM | POA: Diagnosis not present

## 2021-04-03 DIAGNOSIS — M79675 Pain in left toe(s): Secondary | ICD-10-CM | POA: Diagnosis not present

## 2021-04-17 DIAGNOSIS — I10 Essential (primary) hypertension: Secondary | ICD-10-CM | POA: Diagnosis not present

## 2021-04-17 DIAGNOSIS — D513 Other dietary vitamin B12 deficiency anemia: Secondary | ICD-10-CM | POA: Diagnosis not present

## 2021-04-17 DIAGNOSIS — R519 Headache, unspecified: Secondary | ICD-10-CM | POA: Diagnosis not present

## 2021-04-20 DIAGNOSIS — I10 Essential (primary) hypertension: Secondary | ICD-10-CM | POA: Diagnosis not present

## 2021-05-01 DIAGNOSIS — I10 Essential (primary) hypertension: Secondary | ICD-10-CM | POA: Diagnosis not present

## 2021-05-01 DIAGNOSIS — M25562 Pain in left knee: Secondary | ICD-10-CM | POA: Diagnosis not present

## 2021-05-01 DIAGNOSIS — R519 Headache, unspecified: Secondary | ICD-10-CM | POA: Diagnosis not present

## 2021-05-21 DIAGNOSIS — I10 Essential (primary) hypertension: Secondary | ICD-10-CM | POA: Diagnosis not present

## 2021-06-07 DIAGNOSIS — R519 Headache, unspecified: Secondary | ICD-10-CM | POA: Diagnosis not present

## 2021-06-20 DIAGNOSIS — I10 Essential (primary) hypertension: Secondary | ICD-10-CM | POA: Diagnosis not present

## 2021-07-12 DIAGNOSIS — I495 Sick sinus syndrome: Secondary | ICD-10-CM | POA: Diagnosis not present

## 2021-07-12 DIAGNOSIS — Z45018 Encounter for adjustment and management of other part of cardiac pacemaker: Secondary | ICD-10-CM | POA: Diagnosis not present

## 2021-07-23 DIAGNOSIS — I10 Essential (primary) hypertension: Secondary | ICD-10-CM | POA: Diagnosis not present

## 2021-07-30 DIAGNOSIS — Z20822 Contact with and (suspected) exposure to covid-19: Secondary | ICD-10-CM | POA: Diagnosis not present

## 2021-07-30 DIAGNOSIS — I959 Hypotension, unspecified: Secondary | ICD-10-CM | POA: Diagnosis not present

## 2021-07-30 DIAGNOSIS — U071 COVID-19: Secondary | ICD-10-CM | POA: Diagnosis not present

## 2021-08-07 DIAGNOSIS — I10 Essential (primary) hypertension: Secondary | ICD-10-CM | POA: Diagnosis not present

## 2021-08-07 DIAGNOSIS — D513 Other dietary vitamin B12 deficiency anemia: Secondary | ICD-10-CM | POA: Diagnosis not present

## 2021-08-07 DIAGNOSIS — Z712 Person consulting for explanation of examination or test findings: Secondary | ICD-10-CM | POA: Diagnosis not present

## 2021-08-16 DIAGNOSIS — R609 Edema, unspecified: Secondary | ICD-10-CM | POA: Diagnosis not present

## 2021-08-16 DIAGNOSIS — I10 Essential (primary) hypertension: Secondary | ICD-10-CM | POA: Diagnosis not present

## 2021-08-21 DIAGNOSIS — I10 Essential (primary) hypertension: Secondary | ICD-10-CM | POA: Diagnosis not present

## 2021-09-04 DIAGNOSIS — M47812 Spondylosis without myelopathy or radiculopathy, cervical region: Secondary | ICD-10-CM | POA: Diagnosis not present

## 2021-09-04 DIAGNOSIS — E049 Nontoxic goiter, unspecified: Secondary | ICD-10-CM | POA: Diagnosis not present

## 2021-09-04 DIAGNOSIS — D110 Benign neoplasm of parotid gland: Secondary | ICD-10-CM | POA: Diagnosis not present

## 2021-09-04 DIAGNOSIS — M19011 Primary osteoarthritis, right shoulder: Secondary | ICD-10-CM | POA: Diagnosis not present

## 2021-09-11 DIAGNOSIS — I214 Non-ST elevation (NSTEMI) myocardial infarction: Secondary | ICD-10-CM | POA: Diagnosis not present

## 2021-09-11 DIAGNOSIS — I495 Sick sinus syndrome: Secondary | ICD-10-CM | POA: Diagnosis not present

## 2021-09-11 DIAGNOSIS — I10 Essential (primary) hypertension: Secondary | ICD-10-CM | POA: Diagnosis not present

## 2021-09-11 DIAGNOSIS — I480 Paroxysmal atrial fibrillation: Secondary | ICD-10-CM | POA: Diagnosis not present

## 2021-09-11 DIAGNOSIS — Z950 Presence of cardiac pacemaker: Secondary | ICD-10-CM | POA: Diagnosis not present

## 2021-09-11 DIAGNOSIS — T82110S Breakdown (mechanical) of cardiac electrode, sequela: Secondary | ICD-10-CM | POA: Diagnosis not present

## 2021-09-14 DIAGNOSIS — D110 Benign neoplasm of parotid gland: Secondary | ICD-10-CM | POA: Diagnosis not present

## 2021-09-14 DIAGNOSIS — K118 Other diseases of salivary glands: Secondary | ICD-10-CM | POA: Diagnosis not present

## 2021-10-04 DIAGNOSIS — F02A0 Dementia in other diseases classified elsewhere, mild, without behavioral disturbance, psychotic disturbance, mood disturbance, and anxiety: Secondary | ICD-10-CM | POA: Diagnosis not present

## 2021-10-04 DIAGNOSIS — R519 Headache, unspecified: Secondary | ICD-10-CM | POA: Diagnosis not present

## 2021-10-04 DIAGNOSIS — D513 Other dietary vitamin B12 deficiency anemia: Secondary | ICD-10-CM | POA: Diagnosis not present

## 2021-10-04 DIAGNOSIS — I10 Essential (primary) hypertension: Secondary | ICD-10-CM | POA: Diagnosis not present

## 2021-10-24 DIAGNOSIS — I10 Essential (primary) hypertension: Secondary | ICD-10-CM | POA: Diagnosis not present

## 2021-11-21 DIAGNOSIS — I10 Essential (primary) hypertension: Secondary | ICD-10-CM | POA: Diagnosis not present

## 2021-12-06 DIAGNOSIS — E559 Vitamin D deficiency, unspecified: Secondary | ICD-10-CM | POA: Diagnosis not present

## 2021-12-06 DIAGNOSIS — F02A0 Dementia in other diseases classified elsewhere, mild, without behavioral disturbance, psychotic disturbance, mood disturbance, and anxiety: Secondary | ICD-10-CM | POA: Diagnosis not present

## 2021-12-06 DIAGNOSIS — I10 Essential (primary) hypertension: Secondary | ICD-10-CM | POA: Diagnosis not present

## 2021-12-06 DIAGNOSIS — D513 Other dietary vitamin B12 deficiency anemia: Secondary | ICD-10-CM | POA: Diagnosis not present

## 2021-12-06 DIAGNOSIS — R519 Headache, unspecified: Secondary | ICD-10-CM | POA: Diagnosis not present

## 2021-12-06 DIAGNOSIS — Z681 Body mass index (BMI) 19 or less, adult: Secondary | ICD-10-CM | POA: Diagnosis not present

## 2021-12-06 DIAGNOSIS — Z79899 Other long term (current) drug therapy: Secondary | ICD-10-CM | POA: Diagnosis not present

## 2021-12-21 DIAGNOSIS — I10 Essential (primary) hypertension: Secondary | ICD-10-CM | POA: Diagnosis not present

## 2022-01-10 DIAGNOSIS — D513 Other dietary vitamin B12 deficiency anemia: Secondary | ICD-10-CM | POA: Diagnosis not present

## 2022-01-17 DIAGNOSIS — Z4509 Encounter for adjustment and management of other cardiac device: Secondary | ICD-10-CM | POA: Diagnosis not present

## 2022-01-22 DIAGNOSIS — I10 Essential (primary) hypertension: Secondary | ICD-10-CM | POA: Diagnosis not present

## 2022-02-11 DIAGNOSIS — G8929 Other chronic pain: Secondary | ICD-10-CM | POA: Diagnosis not present

## 2022-02-11 DIAGNOSIS — R519 Headache, unspecified: Secondary | ICD-10-CM | POA: Diagnosis not present

## 2022-02-19 DIAGNOSIS — I10 Essential (primary) hypertension: Secondary | ICD-10-CM | POA: Diagnosis not present

## 2022-03-11 DIAGNOSIS — R519 Headache, unspecified: Secondary | ICD-10-CM | POA: Diagnosis not present

## 2022-03-11 DIAGNOSIS — Z79899 Other long term (current) drug therapy: Secondary | ICD-10-CM | POA: Diagnosis not present

## 2022-03-18 DIAGNOSIS — R519 Headache, unspecified: Secondary | ICD-10-CM | POA: Diagnosis not present

## 2022-03-18 DIAGNOSIS — R011 Cardiac murmur, unspecified: Secondary | ICD-10-CM | POA: Diagnosis not present

## 2022-03-18 DIAGNOSIS — M25562 Pain in left knee: Secondary | ICD-10-CM | POA: Diagnosis not present

## 2022-03-18 DIAGNOSIS — D513 Other dietary vitamin B12 deficiency anemia: Secondary | ICD-10-CM | POA: Diagnosis not present

## 2022-03-26 DIAGNOSIS — I10 Essential (primary) hypertension: Secondary | ICD-10-CM | POA: Diagnosis not present

## 2022-04-23 DIAGNOSIS — I10 Essential (primary) hypertension: Secondary | ICD-10-CM | POA: Diagnosis not present

## 2022-05-09 DIAGNOSIS — G43909 Migraine, unspecified, not intractable, without status migrainosus: Secondary | ICD-10-CM | POA: Diagnosis not present

## 2022-05-09 DIAGNOSIS — Z79899 Other long term (current) drug therapy: Secondary | ICD-10-CM | POA: Diagnosis not present

## 2022-05-16 DIAGNOSIS — B351 Tinea unguium: Secondary | ICD-10-CM | POA: Diagnosis not present

## 2022-05-16 DIAGNOSIS — S91201A Unspecified open wound of right great toe with damage to nail, initial encounter: Secondary | ICD-10-CM | POA: Diagnosis not present

## 2022-05-16 DIAGNOSIS — M79675 Pain in left toe(s): Secondary | ICD-10-CM | POA: Diagnosis not present

## 2022-05-16 DIAGNOSIS — M79674 Pain in right toe(s): Secondary | ICD-10-CM | POA: Diagnosis not present

## 2022-05-21 DIAGNOSIS — I10 Essential (primary) hypertension: Secondary | ICD-10-CM | POA: Diagnosis not present

## 2022-06-17 DIAGNOSIS — R7309 Other abnormal glucose: Secondary | ICD-10-CM | POA: Diagnosis not present

## 2022-06-17 DIAGNOSIS — D519 Vitamin B12 deficiency anemia, unspecified: Secondary | ICD-10-CM | POA: Diagnosis not present

## 2022-06-17 DIAGNOSIS — I10 Essential (primary) hypertension: Secondary | ICD-10-CM | POA: Diagnosis not present

## 2022-06-17 DIAGNOSIS — R011 Cardiac murmur, unspecified: Secondary | ICD-10-CM | POA: Diagnosis not present

## 2022-06-17 DIAGNOSIS — I872 Venous insufficiency (chronic) (peripheral): Secondary | ICD-10-CM | POA: Diagnosis not present

## 2022-06-17 DIAGNOSIS — E539 Vitamin B deficiency, unspecified: Secondary | ICD-10-CM | POA: Diagnosis not present

## 2022-06-17 DIAGNOSIS — R609 Edema, unspecified: Secondary | ICD-10-CM | POA: Diagnosis not present

## 2022-06-17 DIAGNOSIS — D513 Other dietary vitamin B12 deficiency anemia: Secondary | ICD-10-CM | POA: Diagnosis not present

## 2022-06-17 DIAGNOSIS — R519 Headache, unspecified: Secondary | ICD-10-CM | POA: Diagnosis not present

## 2022-06-20 DIAGNOSIS — I10 Essential (primary) hypertension: Secondary | ICD-10-CM | POA: Diagnosis not present

## 2022-07-09 DIAGNOSIS — Z79899 Other long term (current) drug therapy: Secondary | ICD-10-CM | POA: Diagnosis not present

## 2022-07-09 DIAGNOSIS — G43909 Migraine, unspecified, not intractable, without status migrainosus: Secondary | ICD-10-CM | POA: Diagnosis not present

## 2022-07-24 DIAGNOSIS — I10 Essential (primary) hypertension: Secondary | ICD-10-CM | POA: Diagnosis not present

## 2022-07-25 DIAGNOSIS — I480 Paroxysmal atrial fibrillation: Secondary | ICD-10-CM | POA: Diagnosis not present

## 2022-07-25 DIAGNOSIS — Z45018 Encounter for adjustment and management of other part of cardiac pacemaker: Secondary | ICD-10-CM | POA: Diagnosis not present

## 2022-08-20 DIAGNOSIS — G43909 Migraine, unspecified, not intractable, without status migrainosus: Secondary | ICD-10-CM | POA: Diagnosis not present

## 2022-08-20 DIAGNOSIS — Z79899 Other long term (current) drug therapy: Secondary | ICD-10-CM | POA: Diagnosis not present

## 2022-08-22 DIAGNOSIS — I10 Essential (primary) hypertension: Secondary | ICD-10-CM | POA: Diagnosis not present

## 2022-09-25 DIAGNOSIS — F02A0 Dementia in other diseases classified elsewhere, mild, without behavioral disturbance, psychotic disturbance, mood disturbance, and anxiety: Secondary | ICD-10-CM | POA: Diagnosis not present

## 2022-09-25 DIAGNOSIS — D513 Other dietary vitamin B12 deficiency anemia: Secondary | ICD-10-CM | POA: Diagnosis not present

## 2022-09-25 DIAGNOSIS — Z0000 Encounter for general adult medical examination without abnormal findings: Secondary | ICD-10-CM | POA: Diagnosis not present

## 2022-09-25 DIAGNOSIS — I10 Essential (primary) hypertension: Secondary | ICD-10-CM | POA: Diagnosis not present

## 2022-09-25 DIAGNOSIS — R627 Adult failure to thrive: Secondary | ICD-10-CM | POA: Diagnosis not present

## 2022-09-25 DIAGNOSIS — Z681 Body mass index (BMI) 19 or less, adult: Secondary | ICD-10-CM | POA: Diagnosis not present

## 2022-10-03 DIAGNOSIS — E162 Hypoglycemia, unspecified: Secondary | ICD-10-CM | POA: Diagnosis not present

## 2022-10-03 DIAGNOSIS — I10 Essential (primary) hypertension: Secondary | ICD-10-CM | POA: Diagnosis not present

## 2022-10-03 DIAGNOSIS — Z79899 Other long term (current) drug therapy: Secondary | ICD-10-CM | POA: Diagnosis not present

## 2022-10-03 DIAGNOSIS — Z743 Need for continuous supervision: Secondary | ICD-10-CM | POA: Diagnosis not present

## 2022-10-03 DIAGNOSIS — R519 Headache, unspecified: Secondary | ICD-10-CM | POA: Diagnosis not present

## 2022-10-03 DIAGNOSIS — R0600 Dyspnea, unspecified: Secondary | ICD-10-CM | POA: Diagnosis not present

## 2022-10-03 DIAGNOSIS — R6889 Other general symptoms and signs: Secondary | ICD-10-CM | POA: Diagnosis not present

## 2022-10-03 DIAGNOSIS — E161 Other hypoglycemia: Secondary | ICD-10-CM | POA: Diagnosis not present

## 2022-10-03 DIAGNOSIS — N390 Urinary tract infection, site not specified: Secondary | ICD-10-CM | POA: Diagnosis not present

## 2022-10-16 DIAGNOSIS — Z09 Encounter for follow-up examination after completed treatment for conditions other than malignant neoplasm: Secondary | ICD-10-CM | POA: Diagnosis not present

## 2022-10-16 DIAGNOSIS — I951 Orthostatic hypotension: Secondary | ICD-10-CM | POA: Diagnosis not present

## 2022-10-18 DIAGNOSIS — N390 Urinary tract infection, site not specified: Secondary | ICD-10-CM | POA: Diagnosis not present

## 2022-10-18 DIAGNOSIS — R8290 Unspecified abnormal findings in urine: Secondary | ICD-10-CM | POA: Diagnosis not present

## 2022-10-21 DIAGNOSIS — T699XXA Effect of reduced temperature, unspecified, initial encounter: Secondary | ICD-10-CM | POA: Diagnosis not present

## 2022-10-21 DIAGNOSIS — Z743 Need for continuous supervision: Secondary | ICD-10-CM | POA: Diagnosis not present

## 2022-10-21 DIAGNOSIS — R519 Headache, unspecified: Secondary | ICD-10-CM | POA: Diagnosis not present

## 2022-10-21 DIAGNOSIS — G4489 Other headache syndrome: Secondary | ICD-10-CM | POA: Diagnosis not present

## 2022-10-21 DIAGNOSIS — I10 Essential (primary) hypertension: Secondary | ICD-10-CM | POA: Diagnosis not present

## 2022-10-26 DIAGNOSIS — Z79899 Other long term (current) drug therapy: Secondary | ICD-10-CM | POA: Diagnosis not present

## 2022-10-26 DIAGNOSIS — I499 Cardiac arrhythmia, unspecified: Secondary | ICD-10-CM | POA: Diagnosis not present

## 2022-10-26 DIAGNOSIS — R7989 Other specified abnormal findings of blood chemistry: Secondary | ICD-10-CM | POA: Diagnosis not present

## 2022-10-26 DIAGNOSIS — Z7901 Long term (current) use of anticoagulants: Secondary | ICD-10-CM | POA: Diagnosis not present

## 2022-10-26 DIAGNOSIS — M542 Cervicalgia: Secondary | ICD-10-CM | POA: Diagnosis not present

## 2022-10-26 DIAGNOSIS — M1711 Unilateral primary osteoarthritis, right knee: Secondary | ICD-10-CM | POA: Diagnosis not present

## 2022-10-26 DIAGNOSIS — W19XXXA Unspecified fall, initial encounter: Secondary | ICD-10-CM | POA: Diagnosis not present

## 2022-10-26 DIAGNOSIS — Z950 Presence of cardiac pacemaker: Secondary | ICD-10-CM | POA: Diagnosis not present

## 2022-10-26 DIAGNOSIS — R42 Dizziness and giddiness: Secondary | ICD-10-CM | POA: Diagnosis not present

## 2022-10-26 DIAGNOSIS — I119 Hypertensive heart disease without heart failure: Secondary | ICD-10-CM | POA: Diagnosis not present

## 2022-10-26 DIAGNOSIS — Z743 Need for continuous supervision: Secondary | ICD-10-CM | POA: Diagnosis not present

## 2022-10-26 DIAGNOSIS — R6889 Other general symptoms and signs: Secondary | ICD-10-CM | POA: Diagnosis not present

## 2022-10-26 DIAGNOSIS — M47812 Spondylosis without myelopathy or radiculopathy, cervical region: Secondary | ICD-10-CM | POA: Diagnosis not present

## 2022-10-26 DIAGNOSIS — F32A Depression, unspecified: Secondary | ICD-10-CM | POA: Diagnosis not present

## 2022-10-26 DIAGNOSIS — T699XXA Effect of reduced temperature, unspecified, initial encounter: Secondary | ICD-10-CM | POA: Diagnosis not present

## 2022-10-26 DIAGNOSIS — R519 Headache, unspecified: Secondary | ICD-10-CM | POA: Diagnosis not present

## 2022-10-26 DIAGNOSIS — G309 Alzheimer's disease, unspecified: Secondary | ICD-10-CM | POA: Diagnosis not present

## 2022-10-26 DIAGNOSIS — I4891 Unspecified atrial fibrillation: Secondary | ICD-10-CM | POA: Diagnosis not present

## 2022-10-26 DIAGNOSIS — F0283 Dementia in other diseases classified elsewhere, unspecified severity, with mood disturbance: Secondary | ICD-10-CM | POA: Diagnosis not present

## 2022-10-26 DIAGNOSIS — M79661 Pain in right lower leg: Secondary | ICD-10-CM | POA: Diagnosis not present

## 2022-10-26 DIAGNOSIS — R079 Chest pain, unspecified: Secondary | ICD-10-CM | POA: Diagnosis not present

## 2022-10-26 DIAGNOSIS — F0284 Dementia in other diseases classified elsewhere, unspecified severity, with anxiety: Secondary | ICD-10-CM | POA: Diagnosis not present

## 2022-10-26 DIAGNOSIS — R0789 Other chest pain: Secondary | ICD-10-CM | POA: Diagnosis not present

## 2022-10-26 DIAGNOSIS — I4589 Other specified conduction disorders: Secondary | ICD-10-CM | POA: Diagnosis not present

## 2022-10-27 DIAGNOSIS — G309 Alzheimer's disease, unspecified: Secondary | ICD-10-CM | POA: Diagnosis not present

## 2022-10-27 DIAGNOSIS — W19XXXA Unspecified fall, initial encounter: Secondary | ICD-10-CM | POA: Diagnosis not present

## 2022-10-27 DIAGNOSIS — I4891 Unspecified atrial fibrillation: Secondary | ICD-10-CM | POA: Diagnosis not present

## 2022-10-27 DIAGNOSIS — R079 Chest pain, unspecified: Secondary | ICD-10-CM | POA: Diagnosis not present

## 2022-10-28 DIAGNOSIS — I4439 Other atrioventricular block: Secondary | ICD-10-CM | POA: Diagnosis not present

## 2022-10-28 DIAGNOSIS — I517 Cardiomegaly: Secondary | ICD-10-CM | POA: Diagnosis not present

## 2022-10-31 DIAGNOSIS — F02A0 Dementia in other diseases classified elsewhere, mild, without behavioral disturbance, psychotic disturbance, mood disturbance, and anxiety: Secondary | ICD-10-CM | POA: Diagnosis not present

## 2022-10-31 DIAGNOSIS — I959 Hypotension, unspecified: Secondary | ICD-10-CM | POA: Diagnosis not present

## 2022-10-31 DIAGNOSIS — Z09 Encounter for follow-up examination after completed treatment for conditions other than malignant neoplasm: Secondary | ICD-10-CM | POA: Diagnosis not present

## 2022-11-02 DIAGNOSIS — J9600 Acute respiratory failure, unspecified whether with hypoxia or hypercapnia: Secondary | ICD-10-CM | POA: Diagnosis not present

## 2022-11-02 DIAGNOSIS — I422 Other hypertrophic cardiomyopathy: Secondary | ICD-10-CM | POA: Diagnosis not present

## 2022-11-02 DIAGNOSIS — I10 Essential (primary) hypertension: Secondary | ICD-10-CM | POA: Diagnosis not present

## 2022-11-02 DIAGNOSIS — J984 Other disorders of lung: Secondary | ICD-10-CM | POA: Diagnosis not present

## 2022-11-02 DIAGNOSIS — I272 Pulmonary hypertension, unspecified: Secondary | ICD-10-CM | POA: Diagnosis not present

## 2022-11-02 DIAGNOSIS — Z743 Need for continuous supervision: Secondary | ICD-10-CM | POA: Diagnosis not present

## 2022-11-02 DIAGNOSIS — I959 Hypotension, unspecified: Secondary | ICD-10-CM | POA: Diagnosis not present

## 2022-11-02 DIAGNOSIS — I3139 Other pericardial effusion (noninflammatory): Secondary | ICD-10-CM | POA: Diagnosis not present

## 2022-11-02 DIAGNOSIS — J9690 Respiratory failure, unspecified, unspecified whether with hypoxia or hypercapnia: Secondary | ICD-10-CM | POA: Diagnosis not present

## 2022-11-02 DIAGNOSIS — I700 Atherosclerosis of aorta: Secondary | ICD-10-CM | POA: Diagnosis not present

## 2022-11-02 DIAGNOSIS — J9601 Acute respiratory failure with hypoxia: Secondary | ICD-10-CM | POA: Diagnosis not present

## 2022-11-02 DIAGNOSIS — G43909 Migraine, unspecified, not intractable, without status migrainosus: Secondary | ICD-10-CM | POA: Diagnosis not present

## 2022-11-02 DIAGNOSIS — Z66 Do not resuscitate: Secondary | ICD-10-CM | POA: Diagnosis not present

## 2022-11-02 DIAGNOSIS — I081 Rheumatic disorders of both mitral and tricuspid valves: Secondary | ICD-10-CM | POA: Diagnosis not present

## 2022-11-02 DIAGNOSIS — E873 Alkalosis: Secondary | ICD-10-CM | POA: Diagnosis not present

## 2022-11-02 DIAGNOSIS — Z7901 Long term (current) use of anticoagulants: Secondary | ICD-10-CM | POA: Diagnosis not present

## 2022-11-02 DIAGNOSIS — R0789 Other chest pain: Secondary | ICD-10-CM | POA: Diagnosis not present

## 2022-11-02 DIAGNOSIS — R29898 Other symptoms and signs involving the musculoskeletal system: Secondary | ICD-10-CM | POA: Diagnosis not present

## 2022-11-02 DIAGNOSIS — R0602 Shortness of breath: Secondary | ICD-10-CM | POA: Diagnosis not present

## 2022-11-02 DIAGNOSIS — R7989 Other specified abnormal findings of blood chemistry: Secondary | ICD-10-CM | POA: Diagnosis not present

## 2022-11-02 DIAGNOSIS — Z515 Encounter for palliative care: Secondary | ICD-10-CM | POA: Diagnosis not present

## 2022-11-02 DIAGNOSIS — N281 Cyst of kidney, acquired: Secondary | ICD-10-CM | POA: Diagnosis not present

## 2022-11-02 DIAGNOSIS — Z950 Presence of cardiac pacemaker: Secondary | ICD-10-CM | POA: Diagnosis not present

## 2022-11-02 DIAGNOSIS — J90 Pleural effusion, not elsewhere classified: Secondary | ICD-10-CM | POA: Diagnosis not present

## 2022-11-02 DIAGNOSIS — Z79899 Other long term (current) drug therapy: Secondary | ICD-10-CM | POA: Diagnosis not present

## 2022-11-02 DIAGNOSIS — F02C0 Dementia in other diseases classified elsewhere, severe, without behavioral disturbance, psychotic disturbance, mood disturbance, and anxiety: Secondary | ICD-10-CM | POA: Diagnosis not present

## 2022-11-02 DIAGNOSIS — I480 Paroxysmal atrial fibrillation: Secondary | ICD-10-CM | POA: Diagnosis not present

## 2022-11-02 DIAGNOSIS — R079 Chest pain, unspecified: Secondary | ICD-10-CM | POA: Diagnosis not present

## 2022-11-02 DIAGNOSIS — R6889 Other general symptoms and signs: Secondary | ICD-10-CM | POA: Diagnosis not present

## 2022-11-02 DIAGNOSIS — G309 Alzheimer's disease, unspecified: Secondary | ICD-10-CM | POA: Diagnosis not present

## 2022-11-03 DIAGNOSIS — I480 Paroxysmal atrial fibrillation: Secondary | ICD-10-CM | POA: Diagnosis not present

## 2022-11-03 DIAGNOSIS — R079 Chest pain, unspecified: Secondary | ICD-10-CM | POA: Diagnosis not present

## 2022-11-03 DIAGNOSIS — R0602 Shortness of breath: Secondary | ICD-10-CM | POA: Diagnosis not present

## 2022-11-03 DIAGNOSIS — G309 Alzheimer's disease, unspecified: Secondary | ICD-10-CM | POA: Diagnosis not present

## 2022-11-04 DIAGNOSIS — J9691 Respiratory failure, unspecified with hypoxia: Secondary | ICD-10-CM | POA: Diagnosis not present

## 2022-11-04 DIAGNOSIS — I422 Other hypertrophic cardiomyopathy: Secondary | ICD-10-CM | POA: Diagnosis not present

## 2022-11-04 DIAGNOSIS — I361 Nonrheumatic tricuspid (valve) insufficiency: Secondary | ICD-10-CM | POA: Diagnosis not present

## 2022-11-04 DIAGNOSIS — Z515 Encounter for palliative care: Secondary | ICD-10-CM | POA: Diagnosis not present

## 2022-11-04 DIAGNOSIS — I4891 Unspecified atrial fibrillation: Secondary | ICD-10-CM | POA: Diagnosis not present

## 2022-11-04 DIAGNOSIS — E8770 Fluid overload, unspecified: Secondary | ICD-10-CM | POA: Diagnosis not present

## 2022-11-04 DIAGNOSIS — I480 Paroxysmal atrial fibrillation: Secondary | ICD-10-CM | POA: Diagnosis not present

## 2022-11-04 DIAGNOSIS — I495 Sick sinus syndrome: Secondary | ICD-10-CM | POA: Diagnosis not present

## 2022-11-04 DIAGNOSIS — G309 Alzheimer's disease, unspecified: Secondary | ICD-10-CM | POA: Diagnosis not present

## 2022-11-04 DIAGNOSIS — Z9989 Dependence on other enabling machines and devices: Secondary | ICD-10-CM | POA: Diagnosis not present

## 2022-11-04 DIAGNOSIS — I3139 Other pericardial effusion (noninflammatory): Secondary | ICD-10-CM | POA: Diagnosis not present

## 2022-11-04 DIAGNOSIS — R0789 Other chest pain: Secondary | ICD-10-CM | POA: Diagnosis not present

## 2022-11-04 DIAGNOSIS — I10 Essential (primary) hypertension: Secondary | ICD-10-CM | POA: Diagnosis not present

## 2022-11-04 DIAGNOSIS — R079 Chest pain, unspecified: Secondary | ICD-10-CM | POA: Diagnosis not present

## 2022-11-04 DIAGNOSIS — Z950 Presence of cardiac pacemaker: Secondary | ICD-10-CM | POA: Diagnosis not present

## 2022-11-04 DIAGNOSIS — J90 Pleural effusion, not elsewhere classified: Secondary | ICD-10-CM | POA: Diagnosis not present

## 2022-11-04 DIAGNOSIS — I21A1 Myocardial infarction type 2: Secondary | ICD-10-CM | POA: Diagnosis not present

## 2022-11-04 DIAGNOSIS — R5383 Other fatigue: Secondary | ICD-10-CM | POA: Diagnosis not present

## 2022-11-04 DIAGNOSIS — G43909 Migraine, unspecified, not intractable, without status migrainosus: Secondary | ICD-10-CM | POA: Diagnosis not present

## 2022-11-04 DIAGNOSIS — R0602 Shortness of breath: Secondary | ICD-10-CM | POA: Diagnosis not present

## 2022-11-04 DIAGNOSIS — Z7189 Other specified counseling: Secondary | ICD-10-CM | POA: Diagnosis not present

## 2022-11-04 DIAGNOSIS — I272 Pulmonary hypertension, unspecified: Secondary | ICD-10-CM | POA: Diagnosis not present

## 2022-11-05 DIAGNOSIS — I10 Essential (primary) hypertension: Secondary | ICD-10-CM | POA: Diagnosis not present

## 2022-11-05 DIAGNOSIS — Z66 Do not resuscitate: Secondary | ICD-10-CM | POA: Diagnosis not present

## 2022-11-05 DIAGNOSIS — Z515 Encounter for palliative care: Secondary | ICD-10-CM | POA: Diagnosis not present

## 2022-11-05 DIAGNOSIS — J90 Pleural effusion, not elsewhere classified: Secondary | ICD-10-CM | POA: Diagnosis not present

## 2022-11-05 DIAGNOSIS — I3139 Other pericardial effusion (noninflammatory): Secondary | ICD-10-CM | POA: Diagnosis not present

## 2022-11-05 DIAGNOSIS — J9691 Respiratory failure, unspecified with hypoxia: Secondary | ICD-10-CM | POA: Diagnosis not present

## 2022-11-05 DIAGNOSIS — J9601 Acute respiratory failure with hypoxia: Secondary | ICD-10-CM | POA: Diagnosis not present

## 2022-11-05 DIAGNOSIS — G43909 Migraine, unspecified, not intractable, without status migrainosus: Secondary | ICD-10-CM | POA: Diagnosis not present

## 2022-11-07 DIAGNOSIS — I959 Hypotension, unspecified: Secondary | ICD-10-CM | POA: Diagnosis not present

## 2022-11-07 DIAGNOSIS — Z515 Encounter for palliative care: Secondary | ICD-10-CM | POA: Diagnosis not present

## 2022-11-07 DIAGNOSIS — R1084 Generalized abdominal pain: Secondary | ICD-10-CM | POA: Diagnosis not present

## 2022-12-02 DEATH — deceased
